# Patient Record
Sex: Male | Born: 1940 | Race: White | Hispanic: No | Marital: Married | Smoking: Former smoker
Health system: Southern US, Community
[De-identification: ages and names within clinical notes are randomized; demographics above are authoritative.]

## PROBLEM LIST (undated history)

## (undated) DIAGNOSIS — G20A1 Parkinson's disease without dyskinesia, without mention of fluctuations: Secondary | ICD-10-CM

## (undated) DIAGNOSIS — E119 Type 2 diabetes mellitus without complications: Secondary | ICD-10-CM

## (undated) DIAGNOSIS — I10 Essential (primary) hypertension: Secondary | ICD-10-CM

## (undated) DIAGNOSIS — E7800 Pure hypercholesterolemia, unspecified: Secondary | ICD-10-CM

## (undated) HISTORY — DX: Parkinson's disease without dyskinesia, without mention of fluctuations: G20.A1

## (undated) HISTORY — PX: MASTOIDECTOMY: SHX711

## (undated) HISTORY — DX: Pure hypercholesterolemia, unspecified: E78.00

## (undated) HISTORY — PX: CATARACT EXTRACTION: SUR2

## (undated) HISTORY — PX: VASECTOMY: SHX75

---

## 2016-12-04 ENCOUNTER — Other Ambulatory Visit: Payer: Self-pay | Admitting: Otolaryngology

## 2016-12-04 DIAGNOSIS — H60391 Other infective otitis externa, right ear: Secondary | ICD-10-CM

## 2016-12-04 DIAGNOSIS — H6611 Chronic tubotympanic suppurative otitis media, right ear: Secondary | ICD-10-CM

## 2016-12-04 DIAGNOSIS — H903 Sensorineural hearing loss, bilateral: Secondary | ICD-10-CM

## 2016-12-09 ENCOUNTER — Ambulatory Visit
Admission: RE | Admit: 2016-12-09 | Discharge: 2016-12-09 | Disposition: A | Discharge status: Home / Self Care | Payer: Medicare Other | Source: Ambulatory Visit | Attending: Otolaryngology | Admitting: Otolaryngology

## 2016-12-09 DIAGNOSIS — H903 Sensorineural hearing loss, bilateral: Secondary | ICD-10-CM

## 2016-12-09 DIAGNOSIS — H60391 Other infective otitis externa, right ear: Secondary | ICD-10-CM

## 2016-12-09 DIAGNOSIS — H6611 Chronic tubotympanic suppurative otitis media, right ear: Secondary | ICD-10-CM

## 2016-12-09 IMAGING — CT CT TEMPORAL BONES W/ CM
1 series · 15 of 30 positions shown, 19 images · IV contrast (iopamidol)
Comparison: None.

CLINICAL DATA: Chronic right otitis media. Ear drainage. Hearing
loss.

Creatinine was obtained on site at [HOSPITAL] at [HOSPITAL].Results: Creatinine 0.7 mg/dL.
EXAM:
CT TEMPORAL BONES WITH CONTRAST
TECHNIQUE: Axial and coronal plane CT imaging of the petrous temporal bones was
performed with thin-collimation image reconstruction after
intravenous contrast administration. Multiplanar CT image
reconstructions were also generated.
CONTRAST:  75mL PFDW5S-4TT IOPAMIDOL (PFDW5S-4TT) INJECTION 61%

[Series 4: axial soft tissue · axial · 0.47mm/px · z∈[-152,-92]mm · 15 of 34 slices shown, 19 images]
[im 2/34  brain]
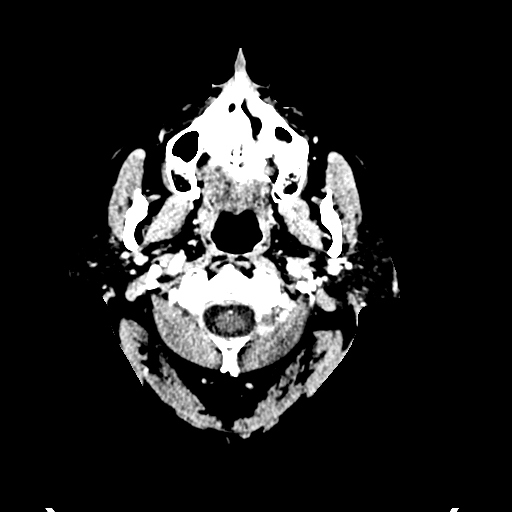
[im 2/34  bone]
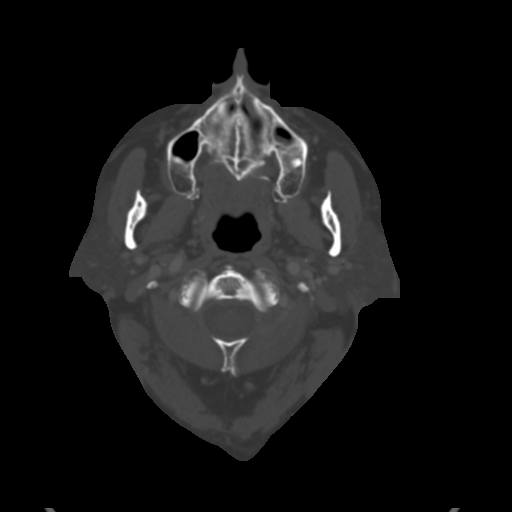
[im 4/34  bone]
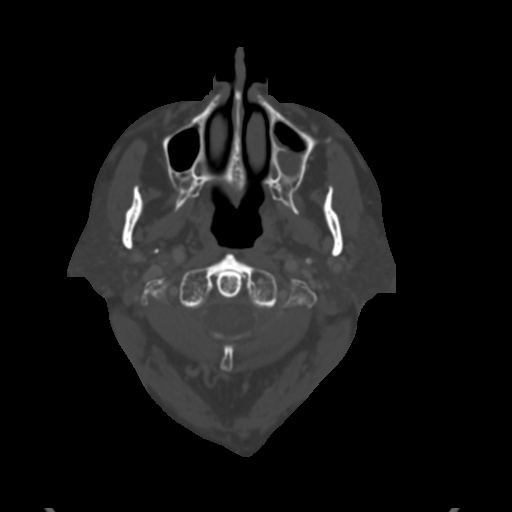
[im 6/34  bone]
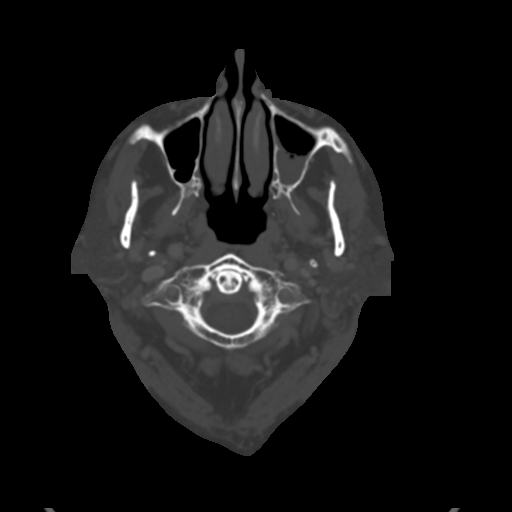
[im 8/34  bone]
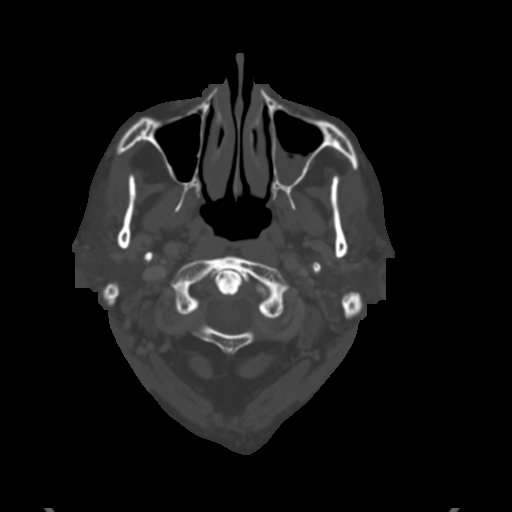
[im 11/34  brain]
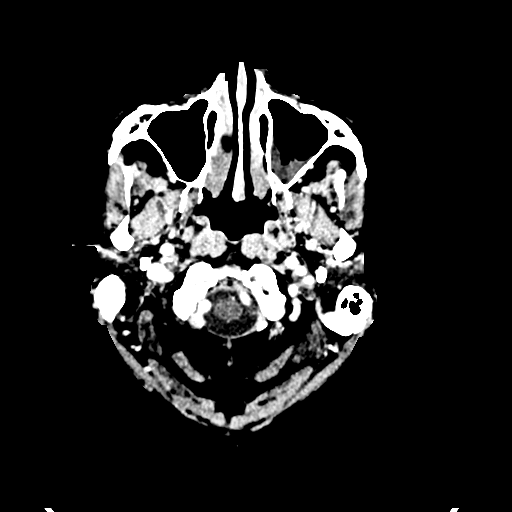
[im 11/34  bone]
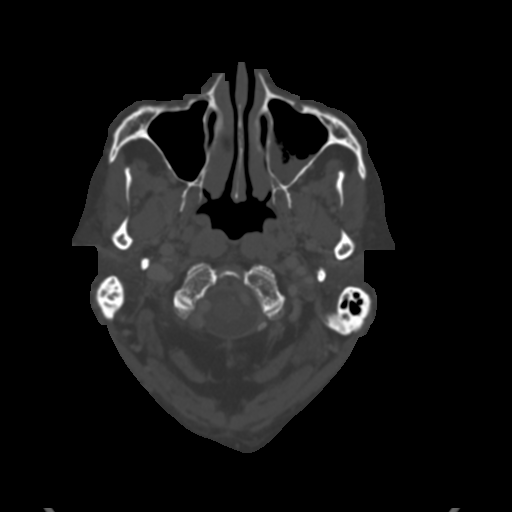
[im 13/34  bone]
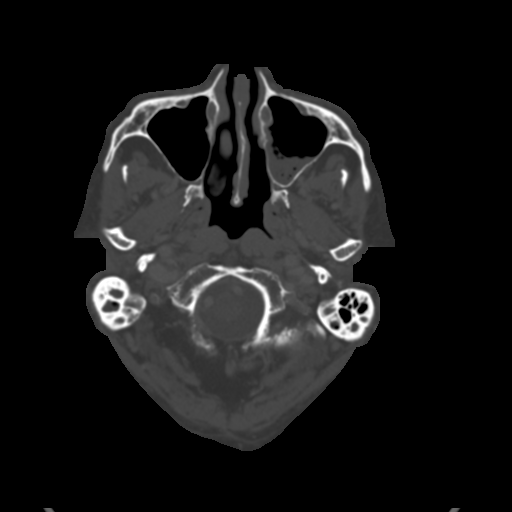
[im 15/34  bone]
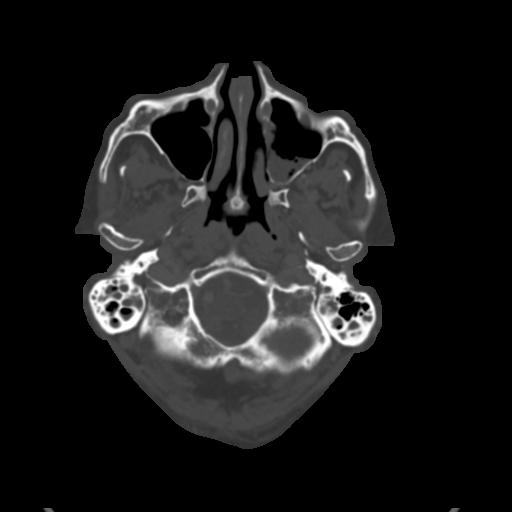
[im 18/34  bone]
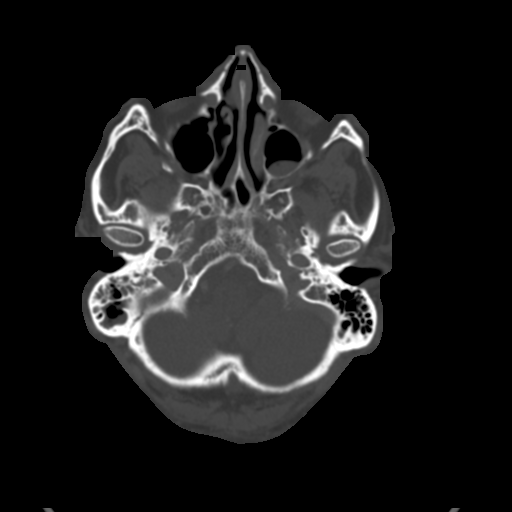
[im 19/34  brain]
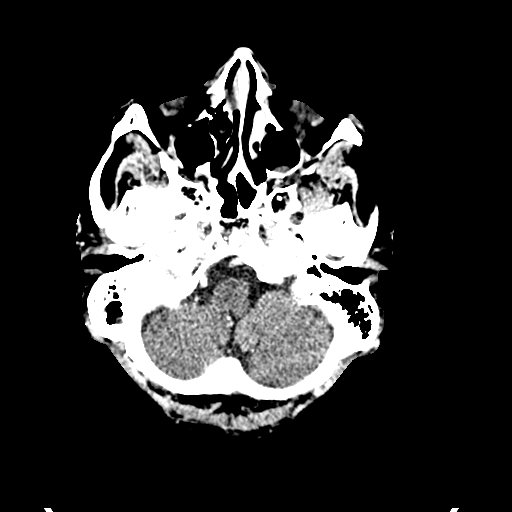
[im 19/34  bone]
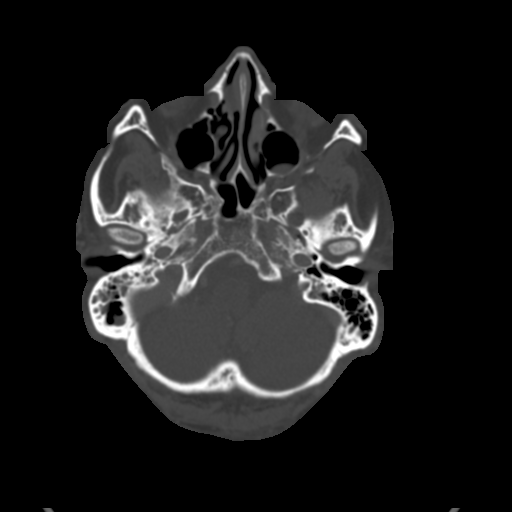
[im 21/34  bone]
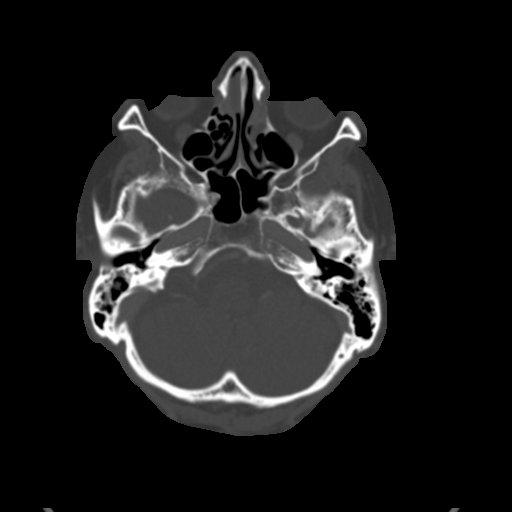
[im 23/34  bone]
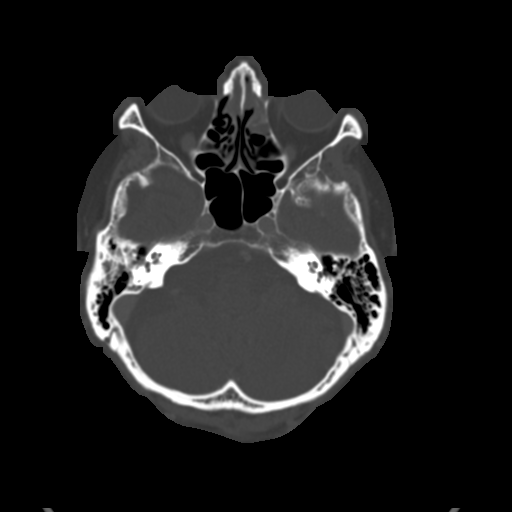
[im 26/34  bone]
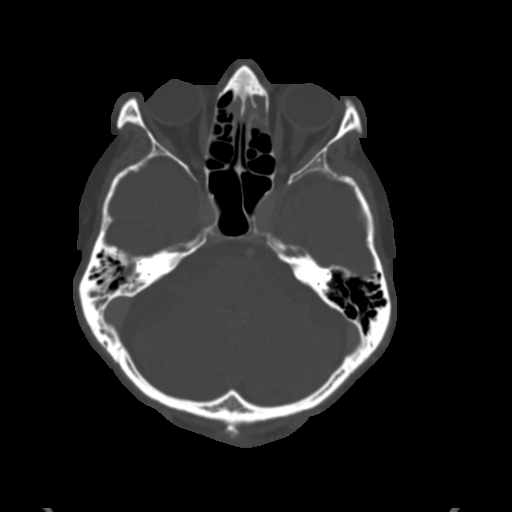
[im 28/34  brain]
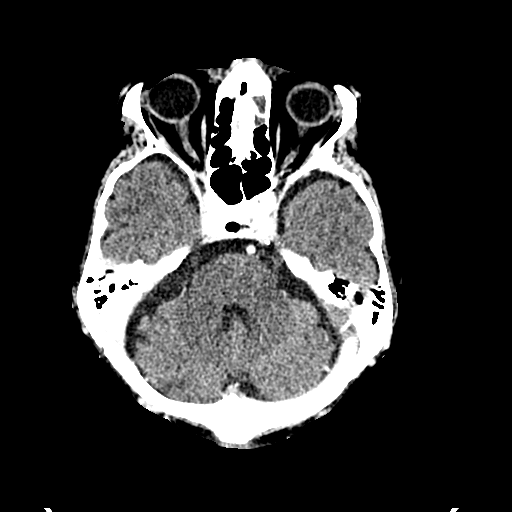
[im 28/34  bone]
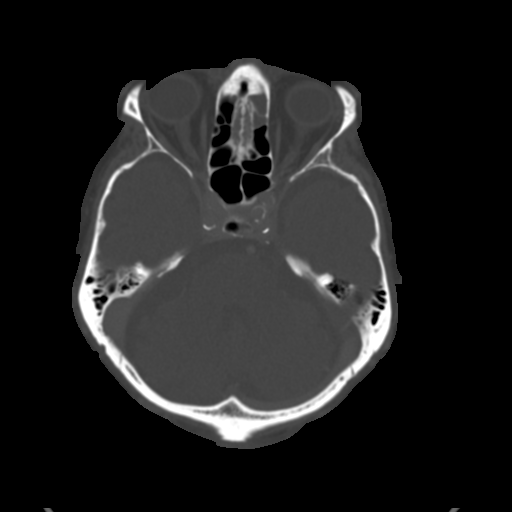
[im 30/34  bone]
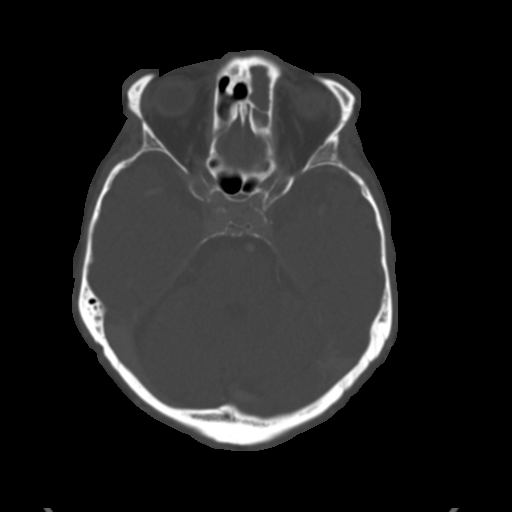
[im 32/34  bone]
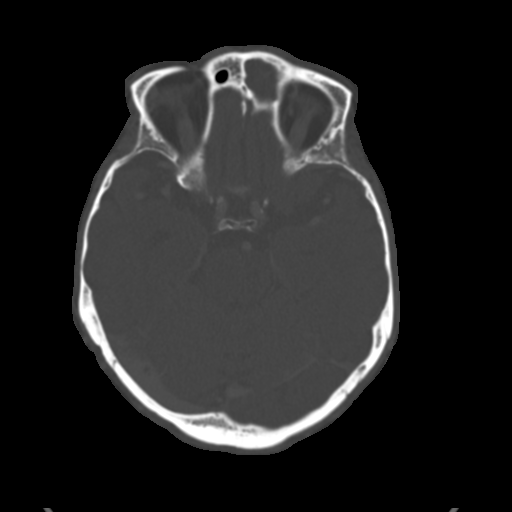

[15 of 30 positions shown; findings below may reference images not displayed]

FINDINGS: RIGHT: There is diffuse thickening of the tympanic membrane.
Moderate volume soft tissue is present in the tympanic cavity,
predominantly located in the epitympanum and partially surrounding
the ossicles. The malleus and incus appear intact, while the stapes
is suboptimally visualized due to surrounding soft tissue. Prussak's
space is opacified, however the scutum appears intact and no osseous
erosive changes are seen elsewhere. The internal auditory canal,
cochlea, vestibule, and semicircular canals are unremarkable. There
is moderate mastoid air cell opacification without coalescence.

LEFT: A tympanostomy tube is present, although it may be partially
extruded. A small volume of strandy soft tissue is present tympanic
cavity, including partially surrounding the stapes which is
suboptimally visualized. The malleus and incus appear intact. The
internal auditory canal, cochlea, vestibule, and semicircular canals
are unremarkable. There is a small mastoid effusion without air cell
coalescence.

There is moderate volume bubbly fluid in the left maxillary sinus.
The visualized portion of the left frontal sinus is completely
opacified, as are the left frontal recess and a few anterior left
ethmoid air cells. The visualized portion of the brain is
unremarkable. Bilateral cataract extraction is noted.
IMPRESSION: 1. Soft tissue in the right tympanic cavity and moderate right
mastoid effusion. This may reflect chronic otomastoiditis. No
erosive osseous changes to suggest cholesteatoma.
2. Small left mastoid effusion with small volume soft tissue in the
left tympanic cavity.
3. Bubbly fluid in the left maxillary sinus. Opacified left frontal
sinus and anterior left ethmoid air cells.

## 2016-12-09 MED ORDER — IOPAMIDOL (ISOVUE-300) INJECTION 61%
75.0000 mL | Freq: Once | INTRAVENOUS | Status: AC | PRN
  Administered 2016-12-09: 75 mL via INTRAVENOUS

## 2016-12-10 ENCOUNTER — Other Ambulatory Visit: Payer: Self-pay | Admitting: Otolaryngology

## 2021-07-18 ENCOUNTER — Encounter (HOSPITAL_BASED_OUTPATIENT_CLINIC_OR_DEPARTMENT_OTHER): Payer: Self-pay | Admitting: Pediatrics

## 2021-07-18 ENCOUNTER — Other Ambulatory Visit: Payer: Self-pay

## 2021-07-18 ENCOUNTER — Telehealth: Payer: Self-pay

## 2021-07-18 ENCOUNTER — Emergency Department (HOSPITAL_BASED_OUTPATIENT_CLINIC_OR_DEPARTMENT_OTHER): Payer: Medicare Other

## 2021-07-18 ENCOUNTER — Emergency Department (HOSPITAL_BASED_OUTPATIENT_CLINIC_OR_DEPARTMENT_OTHER)
Admission: EM | Admit: 2021-07-18 | Discharge: 2021-07-18 | Disposition: A | Discharge status: Home / Self Care | Payer: Medicare Other | Attending: Emergency Medicine | Admitting: Emergency Medicine

## 2021-07-18 DIAGNOSIS — R531 Weakness: Secondary | ICD-10-CM | POA: Diagnosis present

## 2021-07-18 DIAGNOSIS — R5381 Other malaise: Secondary | ICD-10-CM

## 2021-07-18 DIAGNOSIS — E119 Type 2 diabetes mellitus without complications: Secondary | ICD-10-CM | POA: Insufficient documentation

## 2021-07-18 DIAGNOSIS — E860 Dehydration: Secondary | ICD-10-CM | POA: Insufficient documentation

## 2021-07-18 DIAGNOSIS — R55 Syncope and collapse: Secondary | ICD-10-CM | POA: Insufficient documentation

## 2021-07-18 DIAGNOSIS — I10 Essential (primary) hypertension: Secondary | ICD-10-CM | POA: Diagnosis not present

## 2021-07-18 HISTORY — DX: Type 2 diabetes mellitus without complications: E11.9

## 2021-07-18 HISTORY — DX: Essential (primary) hypertension: I10

## 2021-07-18 LAB — URINALYSIS, ROUTINE W REFLEX MICROSCOPIC
Bilirubin Urine: NEGATIVE
Glucose, UA: NEGATIVE mg/dL
Hgb urine dipstick: NEGATIVE
Ketones, ur: 40 mg/dL — AB
Leukocytes,Ua: NEGATIVE
Nitrite: NEGATIVE
Protein, ur: NEGATIVE mg/dL
Specific Gravity, Urine: >=1.03 (ref 1.005–1.030)
pH: 5.5 (ref 5.0–8.0)

## 2021-07-18 LAB — I-STAT VENOUS BLOOD GAS, ED
Acid-Base Excess: 0 mmol/L (ref 0.0–2.0)
Bicarbonate: 25.1 mmol/L (ref 20.0–28.0)
Calcium, Ion: 1.18 mmol/L (ref 1.15–1.40)
HCT: 46 % (ref 39.0–52.0)
Hemoglobin: 15.6 g/dL (ref 13.0–17.0)
O2 Saturation: 59 %
Patient temperature: 98.1
Potassium: 4.2 mmol/L (ref 3.5–5.1)
Sodium: 134 mmol/L — ABNORMAL LOW (ref 135–145)
TCO2: 26 mmol/L (ref 22–32)
pCO2, Ven: 42.3 mmHg — ABNORMAL LOW (ref 44–60)
pH, Ven: 7.381 (ref 7.25–7.43)
pO2, Ven: 31 mmHg — CL (ref 32–45)

## 2021-07-18 LAB — AMMONIA: Ammonia: 11 umol/L (ref 9–35)

## 2021-07-18 LAB — HEPATIC FUNCTION PANEL
ALT: 38 U/L (ref 0–44)
AST: 30 U/L (ref 15–41)
Albumin: 3.9 g/dL (ref 3.5–5.0)
Alkaline Phosphatase: 63 U/L (ref 38–126)
Bilirubin, Direct: 0.3 mg/dL — ABNORMAL HIGH (ref 0.0–0.2)
Indirect Bilirubin: 1.1 mg/dL — ABNORMAL HIGH (ref 0.3–0.9)
Total Bilirubin: 1.4 mg/dL — ABNORMAL HIGH (ref 0.3–1.2)
Total Protein: 6.7 g/dL (ref 6.5–8.1)

## 2021-07-18 LAB — BASIC METABOLIC PANEL
Anion gap: 14 (ref 5–15)
BUN: 20 mg/dL (ref 8–23)
CO2: 22 mmol/L (ref 22–32)
Calcium: 9.4 mg/dL (ref 8.9–10.3)
Chloride: 100 mmol/L (ref 98–111)
Creatinine, Ser: 1.22 mg/dL (ref 0.61–1.24)
GFR, Estimated: 60 mL/min — ABNORMAL LOW (ref >60)
Glucose, Bld: 147 mg/dL — ABNORMAL HIGH (ref 70–99)
Potassium: 4.3 mmol/L (ref 3.5–5.1)
Sodium: 136 mmol/L (ref 135–145)

## 2021-07-18 LAB — CBC
HCT: 45.3 % (ref 39.0–52.0)
Hemoglobin: 15.8 g/dL (ref 13.0–17.0)
MCH: 32.7 pg (ref 26.0–34.0)
MCHC: 34.9 g/dL (ref 30.0–36.0)
MCV: 93.8 fL (ref 80.0–100.0)
Platelets: 209 10*3/uL (ref 150–400)
RBC: 4.83 MIL/uL (ref 4.22–5.81)
RDW: 13.3 % (ref 11.5–15.5)
WBC: 9.6 10*3/uL (ref 4.0–10.5)
nRBC: 0 % (ref 0.0–0.2)

## 2021-07-18 LAB — CK: Total CK: 84 U/L (ref 49–397)

## 2021-07-18 LAB — MAGNESIUM: Magnesium: 1.7 mg/dL (ref 1.7–2.4)

## 2021-07-18 LAB — LIPASE, BLOOD: Lipase: 36 U/L (ref 11–51)

## 2021-07-18 LAB — TROPONIN I (HIGH SENSITIVITY)
Troponin I (High Sensitivity): 5 ng/L (ref <18)
Troponin I (High Sensitivity): 6 ng/L (ref <18)

## 2021-07-18 LAB — CBG MONITORING, ED: Glucose-Capillary: 156 mg/dL — ABNORMAL HIGH (ref 70–99)

## 2021-07-18 IMAGING — CT CT HEAD W/O CM
3 series · 14 of 47 positions shown, 16 images · non-contrast
Comparison: None Available.

CLINICAL DATA: Loss of consciousness for 2-3 minutes last [REDACTED]



[Series 2: head wo · axial · 0.45mm/px · z∈[-165,-15]mm · 8 of 36 slices shown, 10 images]
[im 3/36  brain]
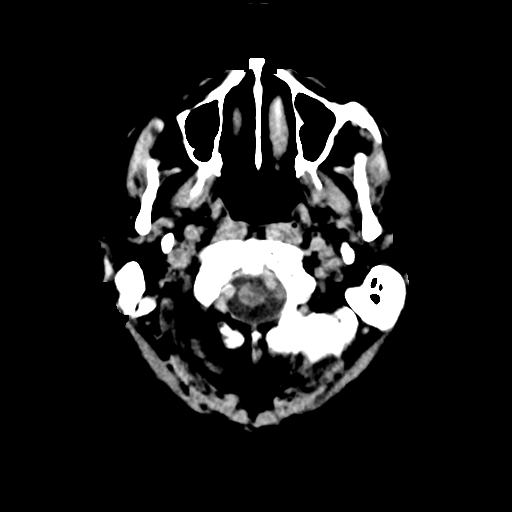
[im 3/36  bone]
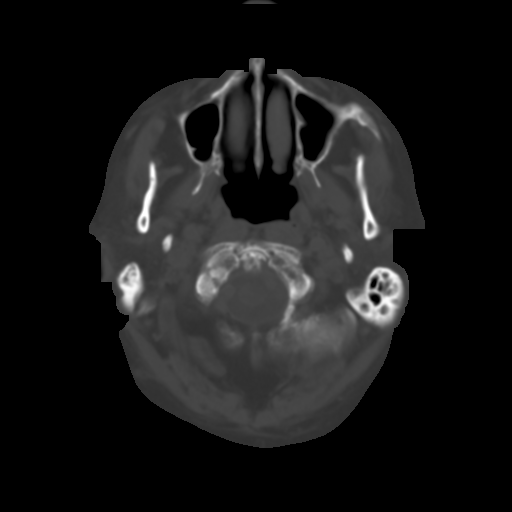
[im 8/36  brain]
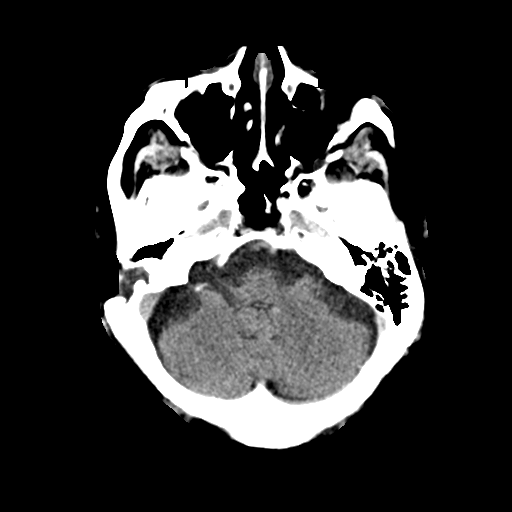
[im 11/36  brain]
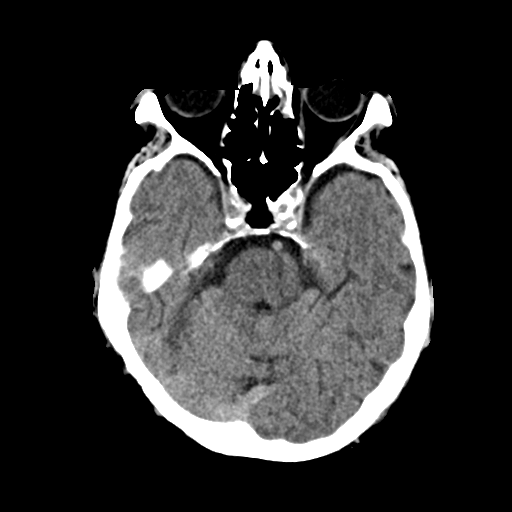
[im 16/36  brain]
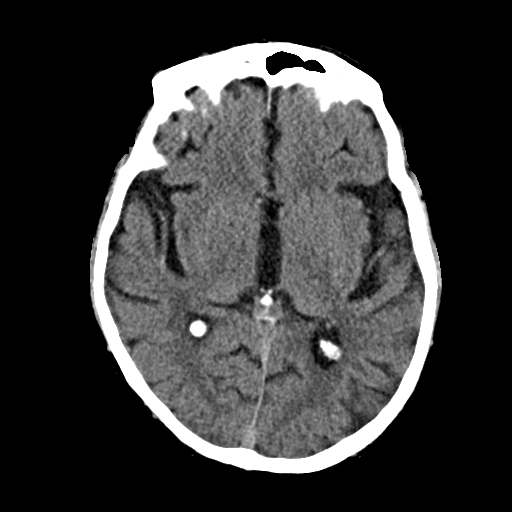
[im 20/36  brain]
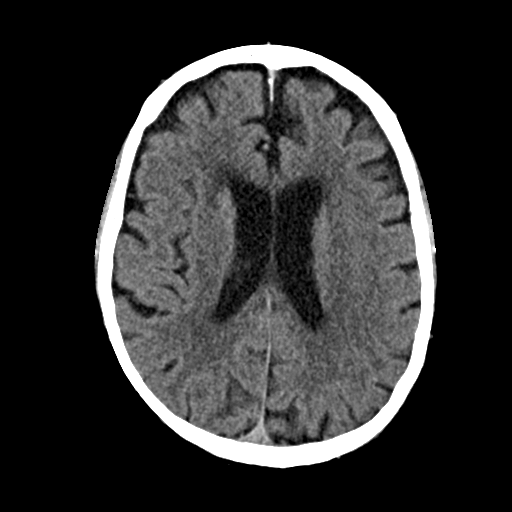
[im 20/36  bone]
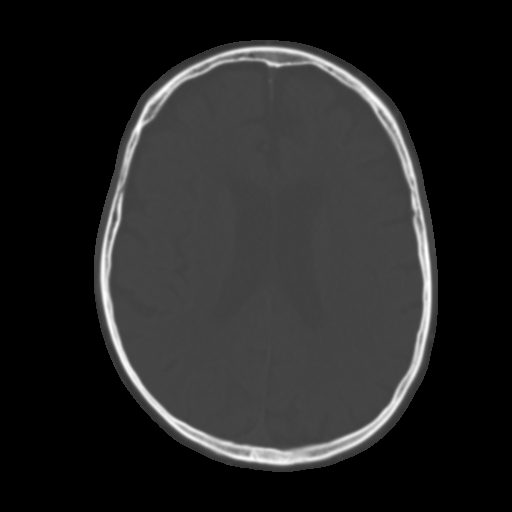
[im 25/36  brain]
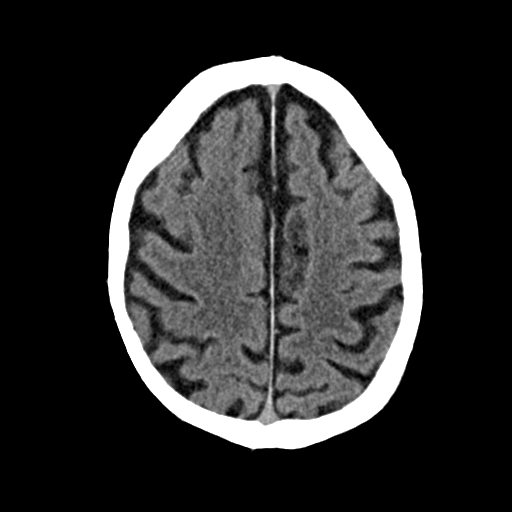
[im 28/36  brain]
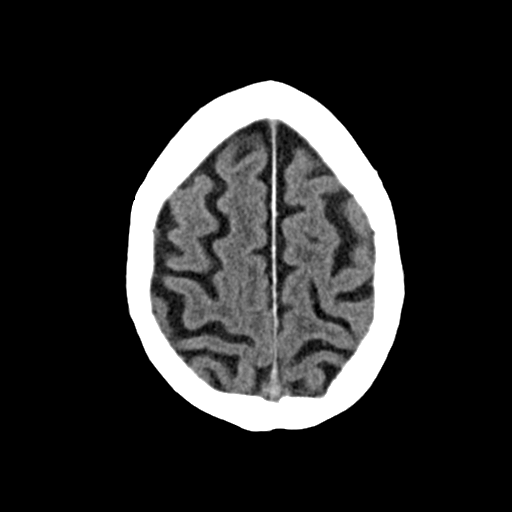
[im 33/36  brain]
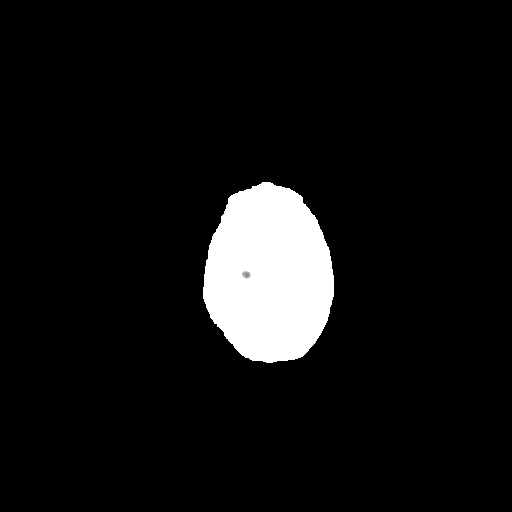

[Series 4: coronal soft · coronal · 0.34mm/px · 3 of 71 slices shown]
[im 24/71  brain]
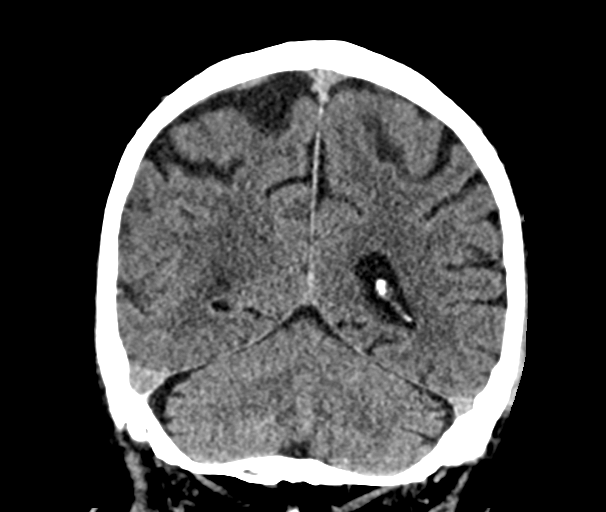
[im 32/71  brain]
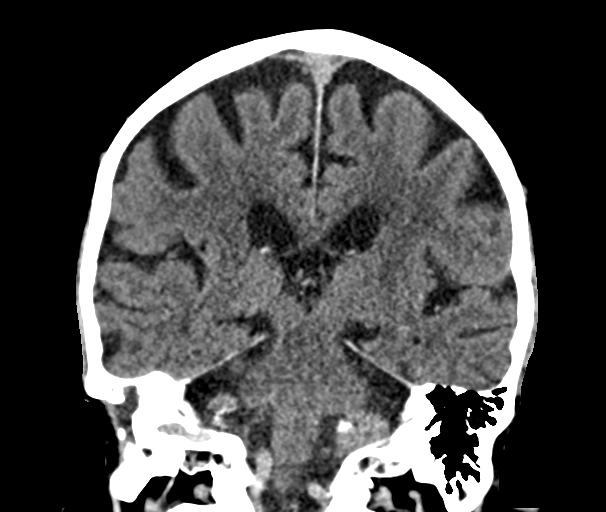
[im 39/71  brain]
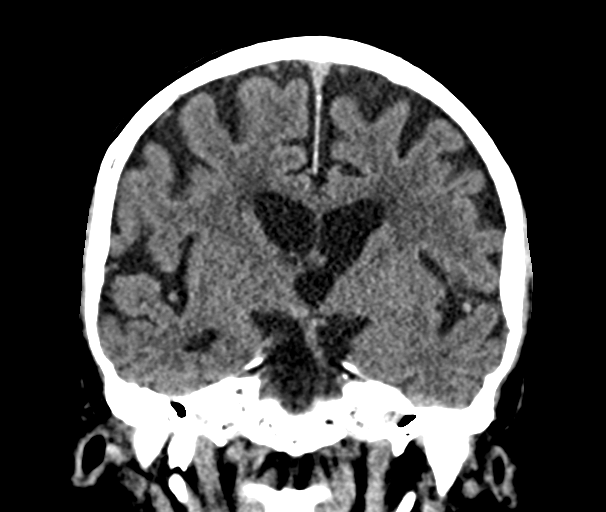

[Series 5: sag soft · sagittal · 0.34mm/px · 3 of 67 slices shown]
[im 23/67  brain]
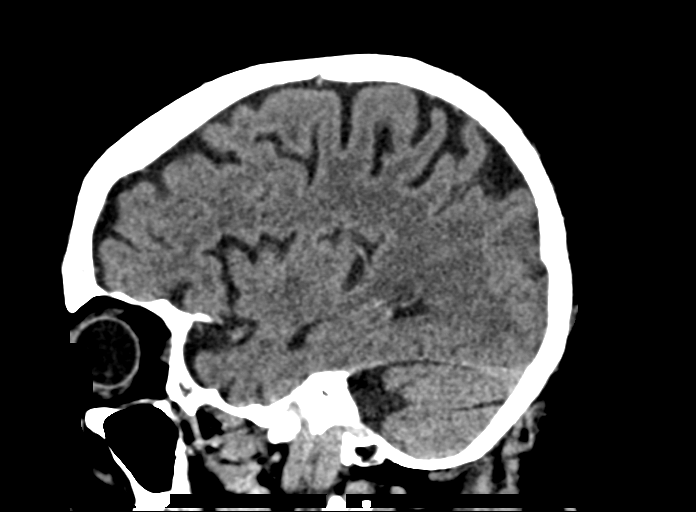
[im 34/67  brain]
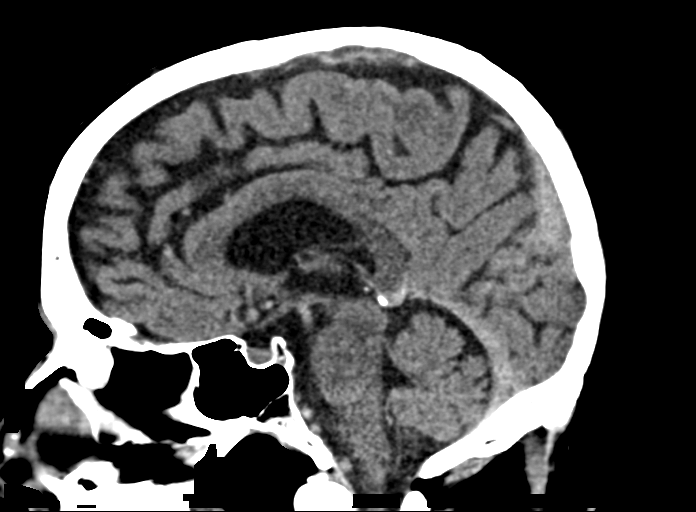
[im 45/67  brain]
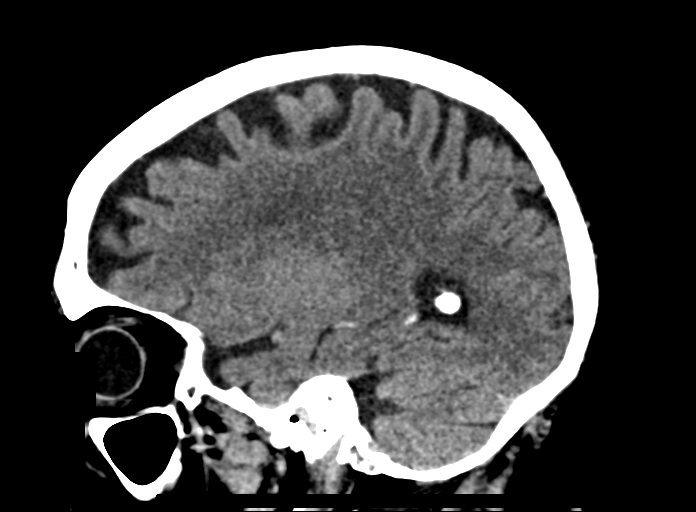

[14 of 47 positions shown; findings below may reference images not displayed]

FINDINGS: Brain: There is no acute intracranial hemorrhage, extra-axial fluid
collection, or acute infarct.

Parenchymal volume is normal. The ventricles are normal in size.
Gray-white differentiation is preserved. Patchy hypodensity in the
subcortical and periventricular white matter likely reflects sequela
of chronic white matter microangiopathy.

There is no mass lesion.  There is no mass effect or midline shift.

Vascular: There is calcification of the bilateral cavernous ICAs.

Skull: Normal. Negative for fracture or focal lesion.

Sinuses/Orbits: There is moderate mucosal thickening in the left
frontal sinus. Bilateral lens implants are in place. The globes and
orbits are otherwise unremarkable.

Other: Postsurgical changes reflecting right canal wall up
mastoidectomy are noted. There is soft tissue density completely
opacifying the mastoid bowl and nearly completely opacifying the
middle ear cavity. There is no definite ossicular erosion. There is
trace fluid in the left mastoid tip.
IMPRESSION: 1. No acute intracranial pathology.
2. Status post right mastoidectomy with complete opacification of
the mastoid bowl and near-complete opacification of the middle ear
cavity. No definite ossicular erosion.

## 2021-07-18 IMAGING — DX DG CHEST 1V PORT
1 series · 1 of 1 positions shown · non-contrast
Comparison: 06/05/2019

CLINICAL DATA: Altered mental status

EXAM:
PORTABLE CHEST 1 VIEW

[chest ap]
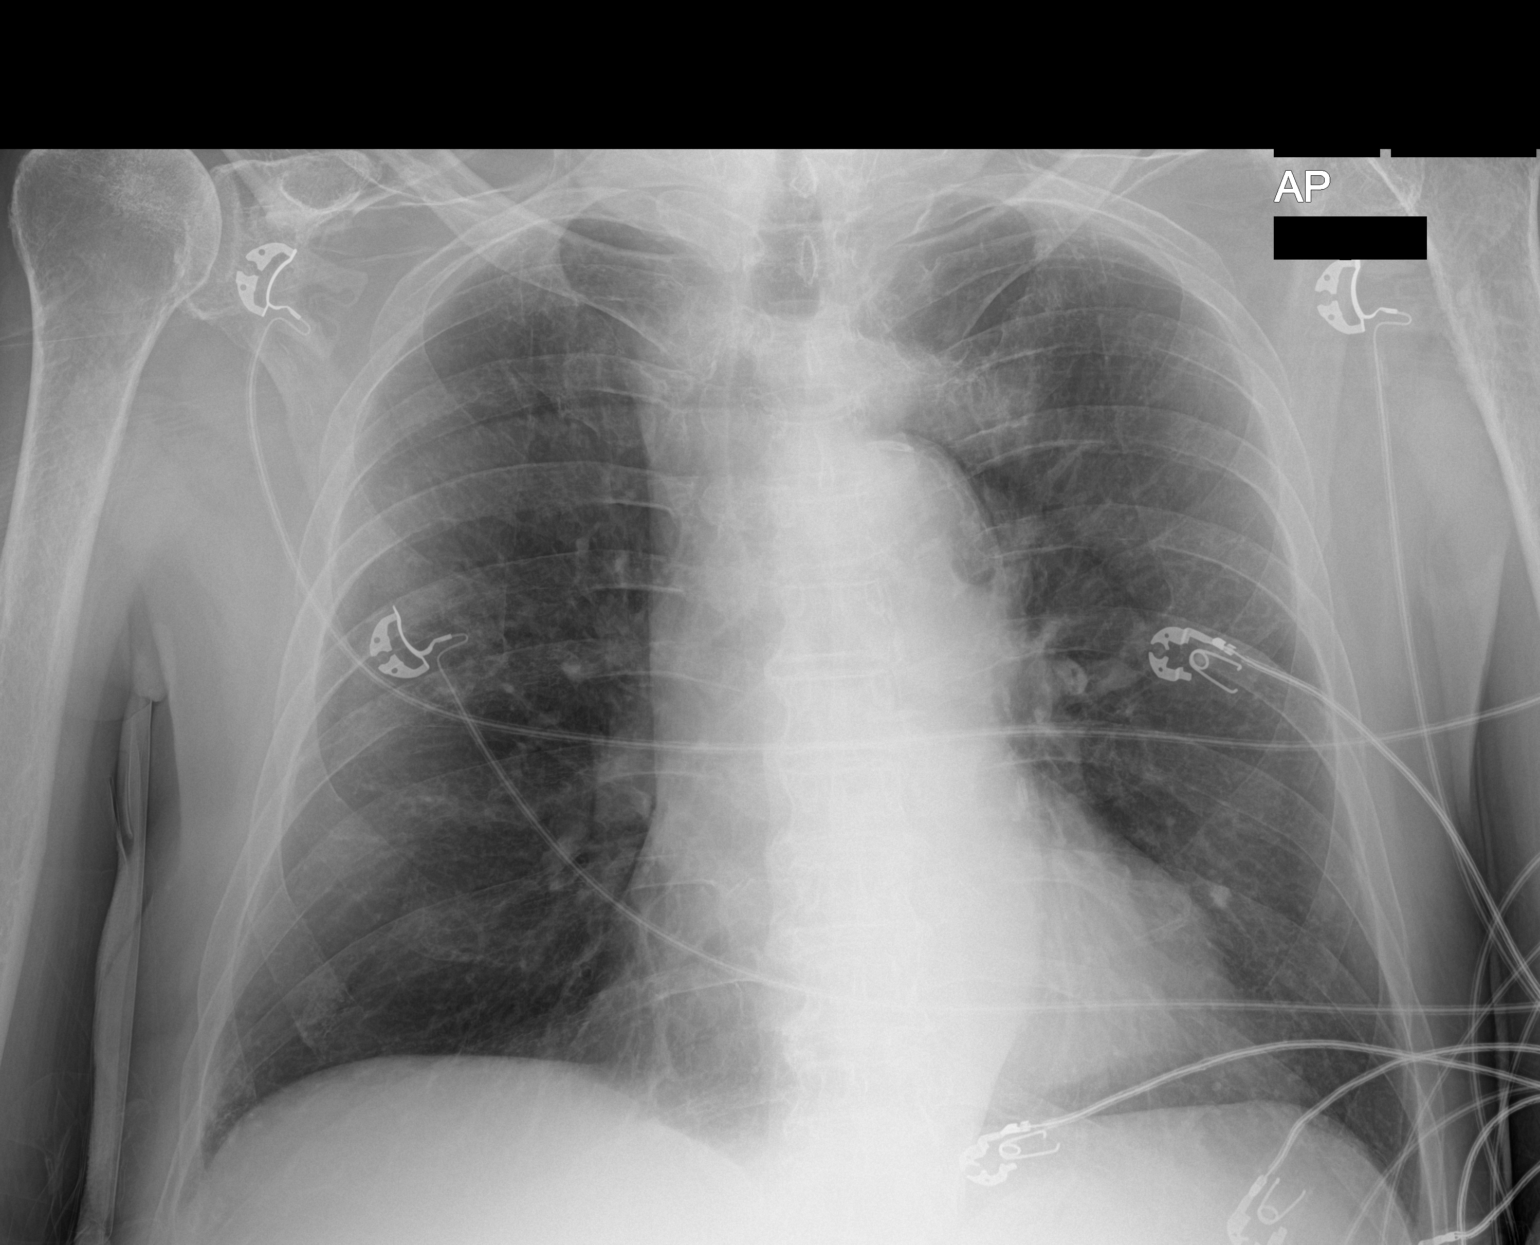

[1 of 1 positions shown; findings below may reference images not displayed]

FINDINGS: Stable cardiomediastinal contours. Aortic atherosclerosis. No focal
airspace consolidation, pleural effusion, or pneumothorax.
IMPRESSION: No active disease.

## 2021-07-18 MED ORDER — SODIUM CHLORIDE 0.9 % IV BOLUS
1000.0000 mL | Freq: Once | INTRAVENOUS | Status: AC
  Administered 2021-07-18: 1000 mL via INTRAVENOUS

## 2021-07-18 NOTE — ED Notes (Signed)
X RAY at bedside 

## 2021-07-18 NOTE — Progress Notes (Signed)
Transition of Care [facility]) - Emergency Department Mini Assessment ? ? ?Patient Details  ?Name: Bob Long ?MRN: [id number] ?Date of Birth: 1940-11-11 ? ?Transition of Care (TOC) CM/SW Contact:    ?Lavenia Atlas, RN ?Phone Number: ?07/18/2021, 12:46 PM ? ? ?Clinical Narrative: ?Patient presents to [facility] ED for generalized weakness, has a hx of falls. RNCM received TOC consult for [facility] services.  ? ? ?ED Mini Assessment: ? RNCM spoke with patient's wife who reports having a lift chair and feels they may need assistance with getting a walk in tub. RNCM  sent referrals to multiple HHC agencies. RNCM advised walk in tub may not be covered under insurance however maybe able to provide resources.  ? ?RNCM awaiting response for acceptance for [facility] services. ?  ?Patient Contact and Communications ?  ?  [name],[name] (Spouse)  ?[phone] [facility] Phone) ? ?Admission diagnosis:  Stroke Symptons ?There are no problems to display for this patient. ? ?PCP:  Daylene Katayama, PA ?Pharmacy:  No Pharmacies Listed  ?

## 2021-07-18 NOTE — ED Notes (Signed)
Patient transported to CT 

## 2021-07-18 NOTE — ED Triage Notes (Addendum)
Arrived POV w/ wife and daughter at bedside; reported patient loss consciousness for approx 2-3 mins while seating down getting bathed by wife last Monday. Wife reported patient loss control of bladder and drooled during the episode. Arrived today from PCP for further eval; patient denies pain when asked; wife concerned he has generalized weakness; stated history of fall about a month ago w/ + head injury. ?

## 2021-07-18 NOTE — Progress Notes (Addendum)
Karoline Caldwell, BSN, RN, CCM, [place] [phone] ?RNCM spoke with pt at bedside regarding discharge planning for Home Health Services. Offered pt medicare.gov list of home health agencies to choose from.  Pt chose [facility] to render services. Pearson Grippe [facility] notified. Patient made aware that [facility] will be in contact in 24-48 hours.  No DME needs identified at this time.  ? ? ?HHC information attached to AVS.  ? ?No additional TOC needs at this time. ?

## 2021-07-18 NOTE — ED Provider Notes (Signed)
?[facility] EMERGENCY DEPARTMENT ?Provider Note ? ? ?CSN: [id number] ?Arrival date & time: 07/18/21  1041 ? ?  ? ?History ? ?Chief Complaint  ?Patient presents with  ? Loss of Consciousness  ? ? ?Bob Long is a 81 y.o. male. ? ? Patient as above with significant medical history as below, including DM, hypertension who presents to the ED with complaint of fall, weakness, deconditioning.  Patient lives with spouse.  Spouse reports over the past 6 months he has been progressively declining.  On Monday she was bathing him and he had a syncopal episode.  He was unconscious for about 2 minutes.  Per the spouse, no seizure-like activity was reported.  No head injury.  She lowered him to the ground.  He was incontinent of urine.  He was not confused/postictal following the episode.  Spouse also reports about a month ago he fell and struck his head on the ground.  Patient was opposed to going to the hospital at that time. ? ?Patient also drinks 2 glasses of wine nightly. ? ? ?Past Medical History: ?No date: Diabetes mellitus without complication (HCC) ?No date: Hypertension ? ?History reviewed. No pertinent surgical history.  ? ? ? ?Loss of Consciousness ?Associated symptoms: no chest pain, no confusion, no fever, no headaches, no nausea, no palpitations, no shortness of breath and no vomiting   ? ?  ? ?Home Medications ?Prior to Admission medications   ?Not on File  ?   ? ?Allergies    ?Patient has no known allergies.   ? ?Review of Systems   ?Review of Systems  ?Constitutional:  Negative for chills and fever.  ?HENT:  Negative for facial swelling and trouble swallowing.   ?Eyes:  Negative for photophobia and visual disturbance.  ?Respiratory:  Negative for cough and shortness of breath.   ?Cardiovascular:  Positive for syncope. Negative for chest pain and palpitations.  ?Gastrointestinal:  Negative for abdominal pain, nausea and vomiting.  ?Endocrine: Negative for polydipsia and polyuria.  ?Genitourinary:   Negative for difficulty urinating and hematuria.  ?Musculoskeletal:  Negative for gait problem and joint swelling.  ?Skin:  Negative for pallor and rash.  ?Neurological:  Positive for syncope and light-headedness. Negative for headaches.  ?Psychiatric/Behavioral:  Negative for agitation and confusion.   ? ?Physical Exam ?Updated Vital Signs ?BP (!) 158/84   Pulse 76   Temp 98.1 ?F (36.7 ?C) (Oral)   Resp 18   Ht 5\' 8"  (1.727 m)   Wt 72.6 kg   SpO2 97%   BMI 24.33 kg/m?  ?Physical Exam ?Vitals and nursing note reviewed.  ?Constitutional:   ?   General: He is not in acute distress. ?   Appearance: Normal appearance. He is well-developed. He is not ill-appearing or diaphoretic.  ?   Comments: Hard of hearing left  ?HENT:  ?   Head: Normocephalic and atraumatic.  ?   Right Ear: External ear normal.  ?   Left Ear: External ear normal.  ?   Mouth/Throat:  ?   Mouth: Mucous membranes are moist.  ?Eyes:  ?   General: No scleral icterus. ?   Extraocular Movements: Extraocular movements intact.  ?   Pupils: Pupils are equal, round, and reactive to light.  ?Cardiovascular:  ?   Rate and Rhythm: Normal rate and regular rhythm.  ?   Pulses: Normal pulses.  ?   Heart sounds: Normal heart sounds.  ?Pulmonary:  ?   Effort: Pulmonary effort is normal. No respiratory distress.  ?  Breath sounds: Normal breath sounds.  ?Abdominal:  ?   General: Abdomen is flat.  ?   Palpations: Abdomen is soft.  ?   Tenderness: There is no abdominal tenderness.  ?Musculoskeletal:     ?   General: Normal range of motion.  ?   Cervical back: Normal range of motion.  ?   Right lower leg: No edema.  ?   Left lower leg: No edema.  ?Skin: ?   General: Skin is warm and dry.  ?   Capillary Refill: Capillary refill takes less than 2 seconds.  ?Neurological:  ?   Mental Status: He is alert and oriented to person, place, and time.  ?   GCS: GCS eye subscore is 4. GCS verbal subscore is 5. GCS motor subscore is 6.  ?   Cranial Nerves: Cranial nerves 2-12  are intact.  ?   Sensory: Sensation is intact.  ?   Motor: Motor function is intact.  ?   Coordination: Coordination is intact.  ?   Gait: Gait is intact.  ?Psychiatric:     ?   Mood and Affect: Mood normal.     ?   Behavior: Behavior normal.  ? ? ?ED Results / Procedures / Treatments   ?Labs ?(all labs ordered are listed, but only abnormal results are displayed) ?Labs Reviewed  ?BASIC METABOLIC PANEL - Abnormal; Notable for the following components:  ?    Result Value  ? Glucose, Bld 147 (*)   ? GFR, Estimated 60 (*)   ? All other components within normal limits  ?URINALYSIS, ROUTINE W REFLEX MICROSCOPIC - Abnormal; Notable for the following components:  ? Ketones, ur 40 (*)   ? All other components within normal limits  ?HEPATIC FUNCTION PANEL - Abnormal; Notable for the following components:  ? Total Bilirubin 1.4 (*)   ? Bilirubin, Direct 0.3 (*)   ? Indirect Bilirubin 1.1 (*)   ? All other components within normal limits  ?CBG MONITORING, ED - Abnormal; Notable for the following components:  ? Glucose-Capillary 156 (*)   ? All other components within normal limits  ?I-STAT VENOUS BLOOD GAS, ED - Abnormal; Notable for the following components:  ? pCO2, Ven 42.3 (*)   ? pO2, Ven 31 (*)   ? Sodium 134 (*)   ? All other components within normal limits  ?CBC  ?CK  ?MAGNESIUM  ?AMMONIA  ?LIPASE, BLOOD  ?CBG MONITORING, ED  ?TROPONIN I (HIGH SENSITIVITY)  ?TROPONIN I (HIGH SENSITIVITY)  ? ? ?EKG ?EKG Interpretation ? ?Date/Time:  Friday Jul 18 2021 10:54:20 EDT ?Ventricular Rate:  85 ?PR Interval:    ?QRS Duration: 139 ?QT Interval:  373 ?QTC Calculation: 444 ?R Axis:   98 ?Text Interpretation: Accelerated junctional rhythm Right bundle branch block Nonspecific T abnormalities, lateral leads Minimal ST elevation, lateral leads Artifact in lead(s) I III aVR aVL aVF V1 V3 V4 V5 V6 Technically poor tracing Interpretation limited secondary to artifact Confirmed by Tanda Rockers ([id number]) on 07/18/2021 1:15:43 PM ? ?Radiology ?CT  HEAD WO CONTRAST ? ?Result Date: 07/18/2021 ?CLINICAL DATA:  Loss of consciousness for 2-3 minutes last Monday EXAM: CT HEAD WITHOUT CONTRAST TECHNIQUE: Contiguous axial images were obtained from the base of the skull through the vertex without intravenous contrast. RADIATION DOSE REDUCTION: This exam was performed according to the departmental dose-optimization program which includes automated exposure control, adjustment of the mA and/or kV according to patient size and/or use of iterative reconstruction technique. COMPARISON:  None  Available. FINDINGS: Brain: There is no acute intracranial hemorrhage, extra-axial fluid collection, or acute infarct. Parenchymal volume is normal. The ventricles are normal in size. Stein Windhorst-white differentiation is preserved. Patchy hypodensity in the subcortical and periventricular white matter likely reflects sequela of chronic white matter microangiopathy. There is no mass lesion.  There is no mass effect or midline shift. Vascular: There is calcification of the bilateral cavernous ICAs. Skull: Normal. Negative for fracture or focal lesion. Sinuses/Orbits: There is moderate mucosal thickening in the left frontal sinus. Bilateral lens implants are in place. The globes and orbits are otherwise unremarkable. Other: Postsurgical changes reflecting right canal wall up mastoidectomy are noted. There is soft tissue density completely opacifying the mastoid bowl and nearly completely opacifying the middle ear cavity. There is no definite ossicular erosion. There is trace fluid in the left mastoid tip. IMPRESSION: 1. No acute intracranial pathology. 2. Status post right mastoidectomy with complete opacification of the mastoid bowl and near-complete opacification of the middle ear cavity. No definite ossicular erosion. Electronically Signed   By: Lesia HausenPeter  Noone M.D.   On: 07/18/2021 11:37  ? ?DG Chest Portable 1 View ? ?Result Date: 07/18/2021 ?CLINICAL DATA:  Altered mental status EXAM: PORTABLE  CHEST 1 VIEW COMPARISON:  06/05/2019 FINDINGS: Stable cardiomediastinal contours. Aortic atherosclerosis. No focal airspace consolidation, pleural effusion, or pneumothorax. IMPRESSION: No active disease. Elect

## 2021-07-18 NOTE — Discharge Instructions (Signed)
Please follow-up with case management regarding home health.  Please also follow-up with your primary care doctor regarding possible rehab placement if this is needed in the future. ? ? ?Have someone stay with you until you feel stable. Do not drive, operate machinery, or play sports until your caregiver says it is okay. Keep all follow-up appointments as directed by your caregiver. Lie down right away if you start feeling like you might faint. Breathe deeply and steadily. Wait until all the symptoms have passed.Drink enough fluids to keep your urine clear or pale yellow. If you are taking blood pressure or heart medicine, get up slowly, taking several minutes to sit and then stand. This can reduce dizziness. SEEK IMMEDIATE MEDICAL CARE IF: You have a severe headache. You have unusual pain in the chest, abdomen, or back. You are bleeding from the mouth or rectum, or you have a black or tarry stool. You have an irregular or very fast heartbeat. You have pain with breathing. You have repeated fainting or seizure-like jerking during an episode. You faint when sitting or lying down. You have confusion. You have difficulty walking. You have severe weakness. You have vision problems. If you fainted, call your local emergency services - do not drive yourself to the hospital. ?  ?Please return to the emergency department immediately for any new or concerning symptoms, or if you get worse. ?

## 2022-08-14 NOTE — Progress Notes (Signed)
 Chief Complaint  Patient presents with  . 6 month follow up    Patient denies concerns today. Wife states patient very weak. Patient has appointment with cardiology 08/24/22.   CC: Parkinson's disease/syncope     The patient is a 82 y.o. right handed White or Caucasian   Male  With unsteady gait, has fallen, not since last visit, bradykinesia, Parkinson's disease.  Tremor a little better. Gets sleepy. Weak all over/fatigue, sleeps a lot. Loss of consciousness 07/18/21 in shower, syncope. Has passed out again 06-20-2022, getting Cardiology work-up.Memory not bad..Some dizziness. Loss of smell recently. He had physical and occupational therapy which helped.. Sinemet makes him sleepy.   Some dyspnea.   The patient denies headaches, weakness,  numbness or tingling, nausea, vomiting, seizures, recent trauma.   Current Outpatient Medications:  .  ammonium lactate 12 % crea, APPLY TOPICALLY TO THE AFFECTED AREA EVERY DAY, Disp: , Rfl:  .  aspirin 81 mg EC tablet, Take 81 mg by mouth Once Daily., Disp: , Rfl:  .  atorvastatin (LIPITOR) 20 mg tablet, Take 1 tablet (20 mg total) by mouth daily., Disp: 90 tablet, Rfl: 1 .  carbidopa-levodopa (SINEMET) 25-100 mg per tablet, Take 1 tablet by mouth 4 (four) times a day., Disp: 360 tablet, Rfl: 1 .  hydrocortisone 2.5 % cream, APPLY TOPICALLY TO FACE TWICE DAILY FOR REDNESS OR SCALING OR ITCHING, Disp: , Rfl:  .  inhalational spacing device (Ace Aerosol [employer]) spcr, Use with albuterol inhaler, Disp: 1 each, Rfl: 0 .  magnesium citrate soln, Take 296 mL by mouth once., Disp: , Rfl:  .  multivit-minerals-folic acid-lycopen-lutein (CertaVite Senior) 0.4 mg-300 mcg- 250 mcg tab tablet, Take 1 tablet by mouth Once Daily., Disp: , Rfl:  .  triamcinolone (KENALOG) 0.1 % cream, APPLY TOPICALLY TO THE AFFECTED AREA TWICE DAILY, Disp: , Rfl:  .  valsartan (DIOVAN) 160 mg tablet, Take 1 tablet (160 mg total) by mouth daily., Disp: 90 tablet, Rfl: 1   Past  Medical History:  Diagnosis Date  . Emphysema of lung (CMS/HCC)   . 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 CATARACT EXTRACTION W/  INTRAOCULAR LENS IMPLANT Bilateral    Procedure: CATARACT EXTRACTION W/ INTRAOCULAR LENS IMPLANT  . COLONOSCOPY W/ BIOPSIES  03/16/2014   Procedure: COLONOSCOPY W/ BIOPSIES; [facility] - repeat in 5 yrs.  SABRA MASTOIDECTOMY Right    Procedure: MASTOIDECTOMY  . SKIN CANCER EXCISION Right 2000   Procedure: SKIN CANCER EXCISION; Arm - melanoma.    Patient Active Problem List  Diagnosis  . Dizziness and giddiness  . Chronic Eustachian tube dysfunction, bilateral  . BPPV (benign paroxysmal positional vertigo), right  . Type 2 diabetes mellitus without complication, without long-term current use of insulin (HCC)  . Hyperlipidemia LDL goal <70  . Hypertension associated with diabetes (HCC)  . Actinic keratoses  . Bilateral chronic serous otitis media  . Bilateral high frequency sensorineural hearing loss  . Aortic atherosclerosis (HCC)  . Coronary artery calcification seen on CT scan  . Centrilobular emphysema (HCC)  . Pulmonary nodule  . History of mastoidectomy  . Elevated TSH  . Wax in ear  . Bradykinesia  . Unsteady gait  . Parkinson disease  . Impacted cerumen of left ear    Allergies:  Patient has no known allergies.  A complete ROS was performed with pertinent positives and negatives noted in the HPI, and all others  were negative except for above.  BP (!) 157/107 (BP Location: Right arm, Patient Position: Sitting) Comment: wife states always runs high  Pulse 94   Wt 71.1 kg (156 lb 12.8 oz)   SpO2 96%   BMI 22.50 kg/m   Mental status is slightly abnormal: Awake, alert, oriented x 3, speech fluent, fair Memory, repetition, naming are intact. Normal fund of knowledge.  Hypophonic.   No carotid bruits.   CN 2 - 12 are normal, pupils equal, extra-occular movements full, face symmetric  with no numbness. Masked fascies. Poor hearing.   Motor: All muscle groups were 5/5 in upper and lower extremities, with no pronator drift. Tone is increased with mild cog-wheeling bilaterally and there is a slight tremor at rest..   Sensation is intact to all modalities bilaterally.   Reflexes are equal bilaterally. 2 throughout.   Cerebellar and gait are without ataxia. Gait unsteady, slow. Using rolling walker.   Results for orders placed or performed in visit on 06/29/22  Urinalysis without Microscopic  Result Value Ref Range   Color, Urine Yellow Yellow   Clarity, Urine Clear Clear   Specific Gravity, Urine 1.014 1.005 - 1.025   pH, Urine 7.0 5.0 - 8.0   Protein, Urine Negative Negative mg/dL   Glucose, Urine Negative Negative mg/dL   Ketones, Urine Negative Negative mg/dL   Bilirubin, Urine Negative Negative   Blood, Urine Negative Negative   Nitrite, Urine Negative Negative   Leukocyte Esterase, Urine Negative Negative   Urobilinogen, Urine Normal <2.0 mg/dL  TSH  Result Value Ref Range   TSH 4.409 0.450 - 5.330 uIU/mL  Comprehensive Metabolic Panel  Result Value Ref Range   Sodium 131 (L) 136 - 145 mmol/L   Potassium 4.2 3.5 - 5.1 mmol/L   Chloride 93 (L) 98 - 107 mmol/L   CO2 27 21 - 31 mmol/L   Anion Gap 11 6 - 14 mmol/L   Glucose, Random 167 (H) 70 - 99 mg/dL   Blood Urea Nitrogen (BUN) 12 7 - 25 mg/dL   Creatinine 8.93 9.29 - 1.30 mg/dL   eGFR 71 >40 fO/fpw/8.26f7   Albumin 4.5 3.5 - 5.7 g/dL   Total Protein 7.3 6.4 - 8.9 g/dL   Bilirubin, Total 1.5 (H) 0.3 - 1.0 mg/dL   Alkaline Phosphatase (ALP) 69 34 - 104 U/L   Aspartate Aminotransferase (AST) 23 13 - 39 U/L   Alanine Aminotransferase (ALT) 17 7 - 52 U/L   Calcium 9.4 8.6 - 10.3 mg/dL   BUN/Creatinine Ratio    Lipid Panel With Reflex Direct LDL  Result Value Ref Range   Cholesterol, Total, Lipid Panel 164 <200 mg/dL   Triglycerides, Lipid Panel 97 <150 mg/dL   HDL Cholesterol - Lipid Panel 63 >=60  mg/dL   LDL Cholesterol, Calculated 82 <100 mg/dL   Non-HDL Cholesterol 898 mg/dL  Hemoglobin J8R With Estimated Average Glucose  Result Value Ref Range   Hemoglobin A1c 6.7 (H) <5.7 %   Estimated Average Glucose 146 mg/dL  Vitamin B12  Result Value Ref Range   Vitamin B-12 740 180 - 914 pg/mL  Albumin, Random Urine  Result Value Ref Range   Albumin, Urine 15 Not Established mg/L   Creatinine, Urine 83 >=20 mg/dL   Albumin/Creatinine Ratio, Urine 18 0 - 30 mg/g creat  CBC with Differential  Result Value Ref Range   WBC 8.40 4.40 - 11.00 10*3/uL   RBC 4.95 4.50 - 5.90 10*6/uL   Hemoglobin 16.3 14.0 -  17.5 g/dL   Hematocrit 52.5 58.4 - 50.4 %   Mean Corpuscular Volume (MCV) 95.6 80.0 - 96.0 fL   Mean Corpuscular Hemoglobin (MCH) 32.8 27.5 - 33.2 pg   Mean Corpuscular Hemoglobin Conc (MCHC) 34.4 33.0 - 37.0 g/dL   Red Cell Distribution Width (RDW) 13.1 12.3 - 17.0 %   Platelet Count (PLT) 209 150 - 450 10*3/uL   Mean Platelet Volume (MPV) 8.1 6.8 - 10.2 fL   Neutrophils % 69 %   Lymphocytes % 22 %   Monocytes % 7 %   Eosinophils % 1 %   Basophils % 0 %   nRBC % 0 %   Neutrophils Absolute 5.90 1.80 - 7.80 10*3/uL   Lymphocytes # 1.90 1.00 - 4.80 10*3/uL   Monocytes # 0.60 0.00 - 0.80 10*3/uL   Eosinophils # 0.10 0.00 - 0.50 10*3/uL   Basophils # 0.00 0.00 - 0.20 10*3/uL   nRBC Absolute 0.00 <=0.00 10*3/uL     Head CT 07/18/21, [place]:  Patchy hypodensity in the  subcortical and periventricular white matter likely reflects sequela  of chronic white matter microangiopathy.   Status post right mastoidectomy with complete opacification of  the mastoid bowl and near-complete opacification of the middle ear  cavity. No definite ossicular erosion.      IMPRESSION:  Parkinson's Disease Bradykinesia/unsteady gait. Syncope 06/09/22, 07/18/21       PLAN:  Continue Sinemet He is having Cardiology work-up    Follow up 6 months

## 2022-08-21 NOTE — Progress Notes (Signed)
 [facility]  [place] Outpatient Consult   PCP: Kreg Lamarr Marin, [place]  Reason for consult: Syncope  Assessment & Plan:   1. Syncope, unspecified syncope type   2. 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 Type 2 diabetes mellitus without complication, without long-term current use of insulin (HCC)   4. Hypertension associated with diabetes (HCC)   5. Aortic atherosclerosis (HCC)   6. Coronary artery calcification seen on CT scan   7. Hyperlipidemia LDL goal <70    Doing well from a cardiac standpoint Infrequent events of syncope  Constant dizziness and balance issues Dyslipidemia, on therapy Hypertension, on therapy Diabetes, on therapy Aortic and coronary atherosclerosis on CT scan   It is difficult to know why he is having presyncopal spells.  His echocardiogram does show LVH but no significant obstruction from that.  Nevertheless he will be quite sensitive to volume and I talked with him about that and their efforts to keep him well-hydrated.  The symptoms do not really sound orthostatic but with Parkinson disease we might consider trying droxidopa or if he continues to have symptoms.  First I would like to get a Zio patch and see if there is any suggestion of a rhythm abnormality.   It appears as though his blood pressure and cholesterol and diabetes are all been very well-controlled. Hospital records reviewed Recent cardiac tests reviewed and summarized Recent labs reviewed and discussed External tests reviewed Prescription drug management: Cardiac meds reviewed; Continue Current medical therapy Schedule: Zio patch Continued/increased exercise recommended weight loss suggested Call if symptoms worsen   Orders  No orders of the defined types were placed in this encounter.  No follow-ups on file.  HPI    Bob Long is a 82 y.o. male with a past medical history significant for LVH, HTN, DM, Parkinson's disease who presents  at the kind request of Marin, Kreg Lamarr*  for evaluation of Syncope  He has complaints of lightheadedness and syncope This began a few years ago. It happens in the shower a lot This occurs 3 times in the last 2 years.   These episodes usually last for several minutes They are associated with complaints of no symptoms. He denies associated Chest pain, shortness of breath, diaphoresis, and palpitations. He  has Chronic complaints of  dizziness, balance problems. He  has No complaints of Chest Pain, shortness of breath, swelling. His  blood pressure has been controlled His Cholesterol has been checked recently His sugar has been controlled He does not  exercise regularly. He does not use tobacco.  No Known Allergies  Medications:   Current Outpatient Medications:  .  aspirin 81 mg EC tablet, Take 81 mg by mouth Once Daily., Disp: , Rfl:  .  atorvastatin (LIPITOR) 20 mg tablet, Take 1 tablet (20 mg total) by mouth daily., Disp: 90 tablet, Rfl: 1 .  carbidopa-levodopa (SINEMET) 25-100 mg per tablet, Take 1 tablet by mouth 4 (four) times a day., Disp: 360 tablet, Rfl: 1 .  hydrocortisone 2.5 % cream, APPLY TOPICALLY TO FACE TWICE DAILY FOR REDNESS OR SCALING OR ITCHING, Disp: , Rfl:  .  magnesium citrate soln, Take 296 mL by mouth once., Disp: , Rfl:  .  multivit-minerals-folic acid-lycopen-lutein (CertaVite Senior) 0.4 mg-300 mcg- 250 mcg tab tablet, Take 1 tablet by mouth Once Daily., Disp: , Rfl:  .  valsartan (DIOVAN) 160 mg tablet, Take 1 tablet (160 mg total) by mouth daily., Disp: 90 tablet, Rfl: 1 .  ammonium lactate 12 % crea, APPLY TOPICALLY TO THE AFFECTED AREA EVERY DAY (Patient not taking: Reported on 08/24/2022), Disp: , Rfl:  .  triamcinolone (KENALOG) 0.1 % cream, APPLY TOPICALLY TO THE AFFECTED AREA TWICE DAILY (Patient not taking: Reported on 08/24/2022), Disp: , Rfl:   Past Medical History:  Diagnosis Date  . Emphysema of lung (CMS/HCC)   . History of melanoma 2000    Rt Arm   Past Surgical History:  Procedure Laterality Date  . CATARACT EXTRACTION W/  INTRAOCULAR LENS IMPLANT Bilateral    Procedure: CATARACT EXTRACTION W/ INTRAOCULAR LENS IMPLANT  . COLONOSCOPY W/ BIOPSIES  03/16/2014   Procedure: COLONOSCOPY W/ BIOPSIES; [facility] - repeat in 5 yrs.  SABRA MASTOIDECTOMY Right    Procedure: MASTOIDECTOMY  . SKIN CANCER EXCISION Right 2000   Procedure: SKIN CANCER EXCISION; Arm - melanoma.   Social History: Social History   Tobacco Use  Smoking Status Former  . Current packs/day: 0.00  . Average packs/day: 1 pack/day for 46.0 years (46.0 ttl pk-yrs)  . Types: Cigarettes  . Start date: 03/16/1957  . Quit date: 03/17/2003  . Years since quitting: 19.4  Smokeless Tobacco Never   Social History   Substance and Sexual Activity  Alcohol Use Yes  . Alcohol/week: 7.0 standard drinks of alcohol   Social History   Substance and Sexual Activity  Drug Use Never    Family History  Problem Relation Name Age of Onset  . Diabetes Neg Hx    . Heart disease Neg Hx    . Hypertension Neg Hx    . Stroke Neg Hx    . Colon cancer Neg Hx    . Prostate cancer Neg Hx    . Parkinsonism Neg Hx    . Alzheimer's disease Neg Hx    . Seizures Neg Hx    . Dementia Neg Hx     Review of Systems: A complete review of systems was negative except for the problems as described above  Physical Examination:  VITAL SIGNS:  Vitals:   08/24/22 1440  BP: 143/87  Pulse: 91  SpO2: 96%  Weight: 70.3 kg (155 lb)  Height: 1.778 m (5' 10)    General: Comfortable in NAD Skin:  Warm & dry HEENT:  Sclera anicteric.  No obvious oropharyngeal abnormalities Resp:  Resp even and nonlabored.  Lungs sounds clear throughout CV:  S1S2 regular rate and rhythm, 2/6 SE murmur noted, No gallops or rubs Abd:  Soft, nontender, NABS Ext:  No cyanosis or edema MSK:  Moving all extremities.  No obvious limitations or significant deficits Neck:  No JVD. no carotid bruit(s) Pulses:  2+  and symmetrical upper and lower extremities.   Objective Data Reviewed During this Patient Encounter:  Test Results Echocardiogram 412/2024: HCM vs HCM mimick, probably preload depleted, no clinically significant obstruction.  The left ventricular cavity is small. 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 The left ventricle is hyperdynamic. LV ejection fraction = >70%. Nonobstructive intracavitary gradient of 22 mm Hg is present.  The left ventricular diastolic function is probably normal for age, with likely normal left atrial pressure.  The right ventricle is normal in size and function.  The atria are normal in size.  There is no significant valvular stenosis or regurgitation.  There was insufficient TR detected to calculate RV systolic pressure.  IVC size was normal.  The aortic sinus is normal size.  There is no significant change in comparison with the last study.  Labs:  Lab Results  Component Value Date   CHOL 164 06/29/2022   TRIG 97 06/29/2022   HDL 63 06/29/2022   LDLCALC 82 06/29/2022   LDLDIRECT 82 07/17/2021   Lab Results  Component Value Date   WBC 8.40 06/29/2022   HGB 16.3 06/29/2022   HCT 47.4 06/29/2022   PLT 209 06/29/2022   Lab Results  Component Value Date   NA 131 (L) 06/29/2022   K 4.2 06/29/2022   CL 93 (L) 06/29/2022   CO2 27 06/29/2022   BUN 12 06/29/2022   CREATININE 1.06 06/29/2022   GLUCOSE 167 (H) 06/29/2022   AST 23 06/29/2022   ALT 17 06/29/2022    Portions of this note were dictated using DRAGON voice recognition software. Please disregard any errors in transcription.  This record has been created using Conservation officer, historic buildings. Errors have been sought and corrected, but may not always be located. Such creation errors do not reflect on the standard of medical care.

## 2022-09-21 NOTE — Progress Notes (Signed)
 Zio patch done 09/21/2022 was unremarkable except for a few brief episodes of atrial tachycardia. Current treatment plan is effective, no change in therapy, Follow-up as documented in EMR

## 2023-05-13 NOTE — Telephone Encounter (Signed)
 error

## 2023-07-15 ENCOUNTER — Encounter: Payer: Self-pay | Admitting: Nurse Practitioner

## 2023-07-15 ENCOUNTER — Ambulatory Visit: Admitting: Nurse Practitioner

## 2023-07-15 VITALS — BP 132/78 | HR 85 | Temp 97.9°F | Resp 19 | Ht 68.0 in | Wt 164.0 lb

## 2023-07-15 DIAGNOSIS — Z87898 Personal history of other specified conditions: Secondary | ICD-10-CM

## 2023-07-15 DIAGNOSIS — F419 Anxiety disorder, unspecified: Secondary | ICD-10-CM | POA: Diagnosis not present

## 2023-07-15 DIAGNOSIS — G20A1 Parkinson's disease without dyskinesia, without mention of fluctuations: Secondary | ICD-10-CM

## 2023-07-15 DIAGNOSIS — E119 Type 2 diabetes mellitus without complications: Secondary | ICD-10-CM | POA: Insufficient documentation

## 2023-07-15 DIAGNOSIS — I10 Essential (primary) hypertension: Secondary | ICD-10-CM

## 2023-07-15 DIAGNOSIS — E785 Hyperlipidemia, unspecified: Secondary | ICD-10-CM

## 2023-07-15 DIAGNOSIS — K5901 Slow transit constipation: Secondary | ICD-10-CM

## 2023-07-15 DIAGNOSIS — E1165 Type 2 diabetes mellitus with hyperglycemia: Secondary | ICD-10-CM

## 2023-07-15 NOTE — Assessment & Plan Note (Addendum)
 followed by Neurology, taking Sinemet.

## 2023-07-15 NOTE — Assessment & Plan Note (Signed)
 TSH 4.409 06/29/22

## 2023-07-15 NOTE — Assessment & Plan Note (Signed)
 on Atorvastatin, LDL 82 06/29/22

## 2023-07-15 NOTE — Assessment & Plan Note (Signed)
 every 3-4 days BM, hard.  Adding Benefiber and MiraLax.

## 2023-07-15 NOTE — Assessment & Plan Note (Signed)
 Blood pressure is controlled, on Valsartan

## 2023-07-15 NOTE — Progress Notes (Addendum)
 Location:   [facility]   Place of Service:    Provider: Larwance Hark NP  Zamier Eggebrecht X, NP  Patient Care Team: Disaya Walt X, NP as PCP - General (Internal Medicine) Charmayne Molly, MD as Consulting Physician (Ophthalmology)  Extended Emergency Contact Information Primary Emergency Contact: [name],[name]  [place] Home Phone: [phone] Mobile Phone: [phone] Relation: Spouse Secondary Emergency Contact: [name],[name] Mobile Phone: [phone] Relation: Son Preferred language: English Interpreter needed? No  Code Status:  DNR Goals of care: Advanced Directive information    10/01/2023    1:53 PM  Advanced Directives  Does Patient Have a Medical Advance Directive? Yes  Type of Advance Directive Out of facility DNR (pink MOST or yellow form)  Does patient want to make changes to medical advance directive? No - Patient declined  Copy of Healthcare Power of Attorney in Chart? No - copy requested     Chief Complaint  Patient presents with   Establish Care    New patient appointment.    HPI:  Pt is a 83 y.o. 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 CT 07/18/21 no acute intracranial process, s/p R mastoidectomy.   Parkinson's disease, followed by Neurology, taking Sinemet .   Anxiety, TSH 4.409 06/29/22  HLD, on Atorvastatin , LDL 82 06/29/22  Constipation, every 3-4 days BM, hard.   Past Medical History:  Diagnosis Date   Diabetes mellitus without complication (HCC)    High cholesterol    Hypertension    Parkinson disease (HCC)    Past Surgical History:  Procedure Laterality Date   CATARACT EXTRACTION     MASTOIDECTOMY     VASECTOMY      No Known Allergies  Allergies as of 07/15/2023   No Known Allergies      Medication List        Accurate as of Jul 15, 2023 11:59 PM. If you  have any questions, ask your nurse or doctor.          albuterol  108 (90 Base) MCG/ACT inhaler Commonly known as: VENTOLIN  HFA Inhale 2 puffs into the lungs every 4 (four) hours as needed for wheezing or shortness of breath.   amLODipine  10 MG tablet Commonly known as: NORVASC  Take 10 mg by mouth.   aspirin  EC 81 MG tablet Take 81 mg by mouth daily.   atorvastatin  20 MG tablet Commonly known as: LIPITOR Take 20 mg by mouth daily.   carbidopa -levodopa  25-100 MG tablet Commonly known as: SINEMET  IR Take 1-2 tablets by mouth See admin instructions. Take 1 tablet by mouth in the morning & at bedtime and 2 tablets at noontime   CENTRUM MINIS MEN 50+ PO Take 1 tablet by mouth daily after supper.   polyethylene glycol 17 g packet Commonly known as: MIRALAX  / GLYCOLAX  Take 17 g by mouth daily as needed for mild constipation.        Review of Systems  Constitutional:  Negative for appetite change and fatigue.  HENT:  Negative for congestion and trouble swallowing.   Eyes:  Negative for visual disturbance.  Respiratory:  Negative for cough, shortness of breath and wheezing.   Cardiovascular:  Negative for chest pain, palpitations and leg swelling.  Gastrointestinal:  Positive for constipation. Negative for abdominal pain, nausea and vomiting.  Genitourinary:  Negative for frequency.  Musculoskeletal:  Positive for arthralgias and gait problem.    Immunization History  Administered Date(s) Administered   Influenza, High Dose Seasonal PF 11/16/2014, 11/27/2015, 12/16/2017, 12/05/2021, 12/08/2022   Moderna Covid-19 Fall Seasonal Vaccine 89yrs & older 12/08/2022   Moderna Covid-19 Vaccine Bivalent Booster 82yrs & up 12/05/2021   PFIZER Comirnaty(Gray Top)Covid-19 Tri-Sucrose Vaccine 12/29/2019, 08/16/2020   PFIZER(Purple Top)SARS-COV-2 Vaccination 04/17/2019, 05/08/2019   Pneumococcal Conjugate-13 11/13/2013   Pneumococcal Polysaccharide-23 07/07/2004, 07/11/2009    RSV,unspecified 04/09/2023   Td (Adult),5 Lf Tetanus Toxid, Preservative Free 08/07/1997   Tdap 11/26/2010   Zoster Recombinant(Shingrix) 12/04/2016, 05/13/2017   Zoster, Live 08/05/2011   Pertinent  Health Maintenance Due  Topic Date Due   INFLUENZA VACCINE  10/15/2023   HEMOGLOBIN A1C  01/27/2024   OPHTHALMOLOGY EXAM  07/25/2024   FOOT EXAM  08/11/2024      07/18/2021   10:57 AM 07/15/2023    2:57 PM  Fall Risk  Falls in the past year?  0  Was there an injury with Fall?  0  Fall Risk Category Calculator  0  (RETIRED) Patient Fall Risk Level Moderate fall risk    Patient at Risk for Falls Due to  No Fall Risks  Fall risk Follow up  Falls evaluation completed     Data saved with a previous flowsheet row definition   Functional Status Survey:    Vitals:   07/15/23 1502  BP: 132/78  Pulse: 85  Resp: 19  Temp: 97.9 F (36.6 C)  SpO2: 96%  Weight: 164 lb (74.4 kg)  Height: 5' 8 (1.727 m)   Body mass index is 24.94 kg/m. Physical Exam Vitals and nursing note reviewed.  Constitutional:      Appearance: Normal appearance.  HENT:     Head: Normocephalic and atraumatic.     Nose: Nose normal.     Mouth/Throat:     Mouth: Mucous membranes are moist.  Eyes:     Extraocular Movements: Extraocular movements intact.     Conjunctiva/sclera: Conjunctivae normal.     Pupils: Pupils are equal, round, and reactive to light.  Cardiovascular:     Rate and Rhythm: Normal rate and regular rhythm.     Heart sounds: Murmur heard.     Comments: SM left sternal boarder.  Pulmonary:     Effort: Pulmonary effort is normal.     Breath sounds: No wheezing, rhonchi or rales.  Abdominal:     General: Bowel sounds are normal.     Palpations: Abdomen is soft.     Tenderness: There is no abdominal tenderness.  Musculoskeletal:        General: No tenderness. Normal range of motion.     Cervical back: Normal range of motion and neck supple.     Right lower leg: No edema.     Left lower  leg: No edema.  Skin:    General: Skin is warm and dry.     Findings: No rash.  Neurological:     General: No focal deficit present.     Mental Status: He is alert and oriented to person, place, and time. Mental status is at baseline.     Motor: Weakness present.     Coordination: Coordination normal.     Gait: Gait abnormal.     Comments: Mild right side facial weakness.   Psychiatric:        Mood and Affect: Mood normal.        Behavior: Behavior normal.  Thought Content: Thought content normal.        Judgment: Judgment normal.     Labs reviewed: Recent Labs    09/15/23 0550 09/16/23 0525 09/17/23 0533  NA 128* 128* 130*  K 4.3 3.6 3.6  CL 98 99 96*  CO2 22 22 23   GLUCOSE 112* 110* 105*  BUN 12 10 13   CREATININE 0.86 0.77 0.88  CALCIUM  8.3* 8.4* 8.5*  MG 1.8 1.7 1.8  PHOS 2.9  --   --    Recent Labs    07/27/23 0809 08/30/23 1025 09/13/23 1538 09/14/23 0547  AST 22 26  --  19  ALT 36 11  --  7  ALKPHOS  --  64  --  68  BILITOT 1.2 1.4*  --  1.6*  PROT 6.9 6.6  --  5.7*  ALBUMIN  --  3.6 2.6* 3.2*   Recent Labs    07/27/23 0809 08/30/23 1025 09/13/23 1538 09/15/23 0550 09/16/23 0525 09/17/23 0533  WBC 9.8 9.6   < > 11.1* 11.4* 9.7  NEUTROABS 7,056 7.9*  --   --   --   --   HGB 16.3 15.5   < > 13.9 13.6 13.9  HCT 47.0 46.0   < > 39.9 38.6* 38.9*  MCV 95.3 97.3   < > 94.8 94.1 93.5  PLT 119* 179   < > 186 177 184   < > = values in this interval not displayed.   Lab Results  Component Value Date   TSH 2.059 09/14/2023   Lab Results  Component Value Date   HGBA1C 6.7 (H) 07/27/2023   Lab Results  Component Value Date   CHOL 153 07/27/2023   HDL 58 07/27/2023   LDLCALC 77 07/27/2023   TRIG 98 07/27/2023   CHOLHDL 2.6 07/27/2023    Significant Diagnostic Results in last 30 days:  ECHOCARDIOGRAM COMPLETE Result Date: 09/15/2023    ECHOCARDIOGRAM REPORT   Patient Name:   DEMITRIUS CRASS Date of Exam: 09/15/2023 Medical Rec #:  [id number]        Height:       68.0 in Accession #:    [id number]      Weight:       159.2 lb Date of Birth:  [birth year]        BSA:          1.855 m Patient Age:    83 years        BP:           148/82 mmHg Patient Gender: M               HR:           79 bpm. Exam Location:  Inpatient Procedure: 2D Echo, Color Doppler and Cardiac Doppler (Both Spectral and Color            Flow Doppler were utilized during procedure). Indications:    Cardiomyopathy Unspecified I42.9  History:        Patient has no prior history of Echocardiogram examinations.                 Risk Factors:Diabetes and Hypertension.  Sonographer:    Tinnie Gosling RDCS Referring Phys: [id number] BURGESS BROCKS AMIN IMPRESSIONS  1. Left ventricular ejection fraction, by estimation, is 65 to 70%. The left ventricle has normal function. The left ventricle has no regional wall motion abnormalities. There is mild concentric left ventricular hypertrophy. Left ventricular diastolic parameters are consistent with  Grade I diastolic dysfunction (impaired relaxation).  2. Right ventricular systolic function is mildly reduced. The right ventricular size is mildly enlarged. There is normal pulmonary artery systolic pressure. The estimated right ventricular systolic pressure is 31.3 mmHg.  3. The mitral valve is normal in structure. No evidence of mitral valve regurgitation. No evidence of mitral stenosis.  4. The aortic valve is tricuspid. There is moderate calcification of the aortic valve. Aortic valve regurgitation is trivial. Mild aortic valve stenosis. Aortic valve area, by VTI measures 1.53 cm. Aortic valve mean gradient measures 11.0 mmHg.  5. The inferior vena cava is normal in size with greater than 50% respiratory variability, suggesting right atrial pressure of 3 mmHg. FINDINGS  Left Ventricle: Left ventricular ejection fraction, by estimation, is 65 to 70%. The left ventricle has normal function. The left ventricle has no regional wall motion abnormalities. The left  ventricular internal cavity size was normal in size. There is  mild concentric left ventricular hypertrophy. Left ventricular diastolic parameters are consistent with Grade I diastolic dysfunction (impaired relaxation). Right Ventricle: The right ventricular size is mildly enlarged. No increase in right ventricular wall thickness. Right ventricular systolic function is mildly reduced. There is normal pulmonary artery systolic pressure. The tricuspid regurgitant velocity  is 2.66 m/s, and with an assumed right atrial pressure of 3 mmHg, the estimated right ventricular systolic pressure is 31.3 mmHg. Left Atrium: Left atrial size was normal in size. Right Atrium: Right atrial size was normal in size. Pericardium: There is no evidence of pericardial effusion. Mitral Valve: The mitral valve is normal in structure. There is mild calcification of the mitral valve leaflet(s). Mild mitral annular calcification. No evidence of mitral valve regurgitation. No evidence of mitral valve stenosis. Tricuspid Valve: The tricuspid valve is normal in structure. Tricuspid valve regurgitation is trivial. Aortic Valve: The aortic valve is tricuspid. There is moderate calcification of the aortic valve. Aortic valve regurgitation is trivial. Mild aortic stenosis is present. Aortic valve mean gradient measures 11.0 mmHg. Aortic valve peak gradient measures 19.4 mmHg. Aortic valve area, by VTI measures 1.53 cm. Pulmonic Valve: The pulmonic valve was normal in structure. Pulmonic valve regurgitation is trivial. Aorta: The aortic root is normal in size and structure. Venous: The inferior vena cava is normal in size with greater than 50% respiratory variability, suggesting right atrial pressure of 3 mmHg. IAS/Shunts: No atrial level shunt detected by color flow Doppler.  LEFT VENTRICLE PLAX 2D LVIDd:         3.70 cm   Diastology LVIDs:         3.00 cm   LV e' medial:    3.92 cm/s LV PW:         1.30 cm   LV E/e' medial:  14.8 LV IVS:         1.30 cm   LV e' lateral:   2.72 cm/s LVOT diam:     1.90 cm   LV E/e' lateral: 21.4 LV SV:         56 LV SV Index:   30 LVOT Area:     2.84 cm  RIGHT VENTRICLE RV S prime:     8.59 cm/s TAPSE (M-mode): 2.0 cm LEFT ATRIUM         Index LA diam:    2.70 cm 1.46 cm/m  AORTIC VALVE AV Area (Vmax):    1.17 cm AV Area (Vmean):   1.29 cm AV Area (VTI):     1.53 cm AV Vmax:  220.00 cm/s AV Vmean:          144.400 cm/s AV VTI:            0.366 m AV Peak Grad:      19.4 mmHg AV Mean Grad:      11.0 mmHg LVOT Vmax:         90.50 cm/s LVOT Vmean:        65.800 cm/s LVOT VTI:          0.198 m LVOT/AV VTI ratio: 0.54  AORTA Ao Root diam: 3.20 cm MITRAL VALVE                TRICUSPID VALVE MV Area (PHT): 2.54 cm     TR Peak grad:   28.3 mmHg MV Decel Time: 299 msec     TR Vmax:        266.00 cm/s MV E velocity: 58.10 cm/s MV A velocity: 100.00 cm/s  SHUNTS MV E/A ratio:  0.58         Systemic VTI:  0.20 m                             Systemic Diam: 1.90 cm Dalton McleanMD Electronically signed by Ezra Kanner Signature Date/Time: 09/15/2023/1:11:12 PM    Final    CT Head Wo Contrast Result Date: 09/13/2023 CLINICAL DATA:  Lightheaded with frequent falls. EXAM: CT HEAD WITHOUT CONTRAST TECHNIQUE: Contiguous axial images were obtained from the base of the skull through the vertex without intravenous contrast. RADIATION DOSE REDUCTION: This exam was performed according to the departmental dose-optimization program which includes automated exposure control, adjustment of the mA and/or kV according to patient size and/or use of iterative reconstruction technique. COMPARISON:  Jul 18, 2021 FINDINGS: Brain: There is generalized cerebral atrophy with widening of the extra-axial spaces and ventricular dilatation. There are areas of decreased attenuation within the white matter tracts of the supratentorial brain, consistent with microvascular disease changes. Vascular: No hyperdense vessel or unexpected calcification.  Skull: Normal. Negative for fracture or focal lesion. Sinuses/Orbits: There is mild anterior left ethmoid sinus mucosal thickening. Postoperative changes consistent with prior right mastoidectomy are noted. Other: None. IMPRESSION: 1. 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 2. No acute intracranial abnormality. 3. Evidence of prior right mastoidectomy. Electronically Signed   By: Suzen Dials M.D.   On: 09/13/2023 18:35   DG Chest Port 1 View Result Date: 09/13/2023 CLINICAL DATA:  Shortness of breath EXAM: PORTABLE CHEST 1 VIEW COMPARISON:  08/30/2023 FINDINGS: No acute airspace disease or effusion. Stable cardiomediastinal silhouette with aortic atherosclerosis. No pneumothorax. Probable calcified granuloma in the left lower lung. IMPRESSION: No active disease. Electronically Signed   By: Luke Bun M.D.   On: 09/13/2023 15:53    Assessment/Plan  Parkinson's disease (HCC)  followed by Neurology, taking Sinemet .   Anxiety TSH 4.409 06/29/22  HLD (hyperlipidemia) on Atorvastatin , LDL 82 06/29/22  History of syncope evaluated by Cardiology, unremarkable workups.   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  Adding Benefiber and MiraLax .    Family/ staff Communication: plan of care reviewed with the patient  Labs/tests ordered:  CBC/diff, CMP/eGFR, TSH, lipids, Vit D, Vit B12, Hgb A1c.

## 2023-07-15 NOTE — Assessment & Plan Note (Signed)
 evaluated by Cardiology, unremarkable workups.

## 2023-07-15 NOTE — Assessment & Plan Note (Signed)
 diet controlled, Hgb A1c 6.7 06/29/22

## 2023-07-26 LAB — HM DIABETES EYE EXAM

## 2023-07-27 ENCOUNTER — Other Ambulatory Visit

## 2023-07-27 ENCOUNTER — Ambulatory Visit: Payer: Self-pay | Admitting: Nurse Practitioner

## 2023-07-31 LAB — CBC WITH DIFFERENTIAL/PLATELET
Absolute Lymphocytes: 1617 {cells}/uL (ref 850–3900)
Absolute Monocytes: 784 {cells}/uL (ref 200–950)
Basophils Absolute: 49 {cells}/uL (ref 0–200)
Basophils Relative: 0.5 %
Eosinophils Absolute: 294 {cells}/uL (ref 15–500)
Eosinophils Relative: 3 %
HCT: 47 % (ref 38.5–50.0)
Hemoglobin: 16.3 g/dL (ref 13.2–17.1)
MCH: 33.1 pg — ABNORMAL HIGH (ref 27.0–33.0)
MCHC: 34.7 g/dL (ref 32.0–36.0)
MCV: 95.3 fL (ref 80.0–100.0)
MPV: 10.9 fL (ref 7.5–12.5)
Monocytes Relative: 8 %
Neutro Abs: 7056 {cells}/uL (ref 1500–7800)
Neutrophils Relative %: 72 %
Platelets: 119 10*3/uL — ABNORMAL LOW (ref 140–400)
RBC: 4.93 10*6/uL (ref 4.20–5.80)
RDW: 12.3 % (ref 11.0–15.0)
Total Lymphocyte: 16.5 %
WBC: 9.8 10*3/uL (ref 3.8–10.8)

## 2023-07-31 LAB — COMPLETE METABOLIC PANEL WITHOUT GFR
AG Ratio: 1.6 (calc) (ref 1.0–2.5)
ALT: 36 U/L (ref 9–46)
AST: 22 U/L (ref 10–35)
Albumin: 4.2 g/dL (ref 3.6–5.1)
Alkaline phosphatase (APISO): 65 U/L (ref 35–144)
BUN: 15 mg/dL (ref 7–25)
CO2: 25 mmol/L (ref 20–32)
Calcium: 9.4 mg/dL (ref 8.6–10.3)
Chloride: 95 mmol/L — ABNORMAL LOW (ref 98–110)
Creat: 1 mg/dL (ref 0.70–1.22)
Globulin: 2.7 g/dL (ref 1.9–3.7)
Glucose, Bld: 161 mg/dL — ABNORMAL HIGH (ref 65–99)
Potassium: 4.8 mmol/L (ref 3.5–5.3)
Sodium: 132 mmol/L — ABNORMAL LOW (ref 135–146)
Total Bilirubin: 1.2 mg/dL (ref 0.2–1.2)
Total Protein: 6.9 g/dL (ref 6.1–8.1)

## 2023-07-31 LAB — LIPID PANEL
Cholesterol: 153 mg/dL (ref <200)
HDL: 58 mg/dL (ref >=40)
LDL Cholesterol (Calc): 77 mg/dL
Non-HDL Cholesterol (Calc): 95 mg/dL (ref <130)
Total CHOL/HDL Ratio: 2.6 (calc) (ref <5.0)
Triglycerides: 98 mg/dL (ref <150)

## 2023-07-31 LAB — HEMOGLOBIN A1C
Hgb A1c MFr Bld: 6.7 % — ABNORMAL HIGH (ref <5.7)
Mean Plasma Glucose: 146 mg/dL
eAG (mmol/L): 8.1 mmol/L

## 2023-07-31 LAB — TSH: TSH: 4.11 m[IU]/L (ref 0.40–4.50)

## 2023-07-31 LAB — VITAMIN D 1,25 DIHYDROXY
Vitamin D 1, 25 (OH)2 Total: 37 pg/mL (ref 18–72)
Vitamin D2 1, 25 (OH)2: <8 pg/mL
Vitamin D3 1, 25 (OH)2: 37 pg/mL

## 2023-07-31 LAB — VITAMIN B12: Vitamin B-12: 872 pg/mL (ref 200–1100)

## 2023-08-02 ENCOUNTER — Encounter: Payer: Self-pay | Admitting: Nurse Practitioner

## 2023-08-02 DIAGNOSIS — E871 Hypo-osmolality and hyponatremia: Secondary | ICD-10-CM | POA: Insufficient documentation

## 2023-08-12 ENCOUNTER — Encounter: Payer: Self-pay | Admitting: Nurse Practitioner

## 2023-08-12 ENCOUNTER — Non-Acute Institutional Stay: Admitting: Nurse Practitioner

## 2023-08-12 VITALS — BP 124/72 | HR 96 | Temp 98.6°F | Ht 68.0 in | Wt 163.0 lb

## 2023-08-12 DIAGNOSIS — E871 Hypo-osmolality and hyponatremia: Secondary | ICD-10-CM

## 2023-08-12 DIAGNOSIS — I10 Essential (primary) hypertension: Secondary | ICD-10-CM | POA: Diagnosis not present

## 2023-08-12 DIAGNOSIS — F419 Anxiety disorder, unspecified: Secondary | ICD-10-CM

## 2023-08-12 DIAGNOSIS — E785 Hyperlipidemia, unspecified: Secondary | ICD-10-CM

## 2023-08-12 DIAGNOSIS — E1165 Type 2 diabetes mellitus with hyperglycemia: Secondary | ICD-10-CM | POA: Diagnosis not present

## 2023-08-12 DIAGNOSIS — Z87898 Personal history of other specified conditions: Secondary | ICD-10-CM | POA: Diagnosis not present

## 2023-08-12 DIAGNOSIS — K5901 Slow transit constipation: Secondary | ICD-10-CM

## 2023-08-12 DIAGNOSIS — G20A1 Parkinson's disease without dyskinesia, without mention of fluctuations: Secondary | ICD-10-CM

## 2023-08-12 NOTE — Patient Instructions (Addendum)
 1.) Visit your local pharmacy to receive your tetanus vaccine if you have not already.   2.) You have a non-fasting lab appointment on June 19th @ 7am at [facility].

## 2023-08-12 NOTE — Assessment & Plan Note (Signed)
 Mild, Na 132 07/27/23, monitor.

## 2023-08-12 NOTE — Assessment & Plan Note (Signed)
 Diet controlled, baseline bowel habit every 3-4 days BM, hard.

## 2023-08-12 NOTE — Assessment & Plan Note (Signed)
 diet controlled, Hgb A1c 6.7 07/27/23

## 2023-08-12 NOTE — Progress Notes (Signed)
 Location:   Clinic [facility]   Place of Service:  Clinic (12) Provider: Kerman Peck NP  Code Status: DNR Goals of Care:     07/15/2023    2:58 PM  Advanced Directives  Does Patient Have a Medical Advance Directive? Yes  Type of Estate agent of [place];Living will  Does patient want to make changes to medical advance directive? No - Patient declined  Copy of Healthcare Power of Attorney in Chart? No - copy requested     Chief Complaint  Patient presents with   Medical Management of Chronic Issues    Routine follow up visit and foot exam.  Discuss the need for the following vaccines:  TD Diabetic kidney eval  Pt also needs AWV  Eye exam (requested results)     HPI: Patient is a 83 y.o. male seen today for managing chronic medication conditions.   T2DM, diet controlled, Hgb A1c 6.7 07/27/23             HTN, on Valsartan, K 4.8, Bun/creat 15/1.0 07/27/23, no significant orthostatic blood pressure, HR changes.              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 CT 07/18/21 no acute intracranial process, s/p R mastoidectomy.              Parkinson's disease, followed by Neurology, taking Sinemet.              Anxiety, TSH 4.11, Vit B 872, Vit D 37 07/27/23             HLD, on Atorvastatin, LDL 77 07/27/23             Constipation, every 3-4 days BM, hard, prn MiraLax.  Hyponatremia, Na 132 07/27/23   Past Medical History:  Diagnosis Date   Diabetes mellitus without complication (HCC)    High cholesterol    Hypertension    Parkinson disease (HCC)     Past Surgical History:  Procedure Laterality Date   CATARACT EXTRACTION     MASTOIDECTOMY     VASECTOMY      No Known Allergies  Allergies as of 08/12/2023   No Known Allergies      Medication List        Accurate as of Aug 12, 2023  4:32 PM. If you have any questions, ask your nurse or doctor.          albuterol 108 (90 Base)  MCG/ACT inhaler Commonly known as: VENTOLIN HFA Inhale 2 puffs into the lungs every 4 (four) hours as needed.   amLODipine 10 MG tablet Commonly known as: NORVASC Take 10 mg by mouth.   aspirin EC 81 MG tablet Take 81 mg by mouth.   atorvastatin 20 MG tablet Commonly known as: LIPITOR Take 20 mg by mouth daily.   carbidopa-levodopa 25-100 MG tablet Commonly known as: SINEMET IR Take 1 tablet by mouth 4 (four) times daily.   CENTRUM MINIS MEN 50+ PO Take by mouth daily.   polyethylene glycol 17 g packet Commonly known as: MIRALAX / GLYCOLAX Take 17 g by mouth daily.        Review of Systems:  Review of Systems  Constitutional:  Negative for appetite change and fatigue.  HENT:  Negative for congestion and trouble swallowing.   Eyes:  Negative for visual disturbance.  Respiratory:  Negative for cough,  shortness of breath and wheezing.   Cardiovascular:  Negative for chest pain, palpitations and leg swelling.  Gastrointestinal:  Positive for constipation. Negative for abdominal pain, nausea and vomiting.  Genitourinary:  Negative for frequency.  Musculoskeletal:  Positive for arthralgias and gait problem.    Health Maintenance  Topic Date Due   OPHTHALMOLOGY EXAM  Never done   Diabetic kidney evaluation - Urine ACR  Never done   DTaP/Tdap/Td (2 - Td or Tdap) 11/25/2020   Medicare Annual Wellness (AWV)  06/26/2023   COVID-19 Vaccine (7 - Pfizer risk 2024-25 season) 12/14/2023 (Originally 06/07/2023)   INFLUENZA VACCINE  10/15/2023   HEMOGLOBIN A1C  01/27/2024   Diabetic kidney evaluation - eGFR measurement  07/26/2024   FOOT EXAM  08/11/2024   Pneumonia Vaccine 51+ Years old  Completed   Zoster Vaccines- Shingrix  Completed   HPV VACCINES  Aged Out   Meningococcal B Vaccine  Aged Out    Physical Exam: Vitals:   08/12/23 0926 08/12/23 1418 08/12/23 1419 08/12/23 1420  BP: 124/72     Pulse: 96     Temp: 98.6 F (37 C)     SpO2: 96% 97% 98% 97%  Height: 5\' 8"   (1.727 m)      Body mass index is 24.94 kg/m. Physical Exam Vitals and nursing note reviewed.  Constitutional:      Appearance: Normal appearance.  HENT:     Head: Normocephalic and atraumatic.     Nose: Nose normal.     Mouth/Throat:     Mouth: Mucous membranes are moist.  Eyes:     Extraocular Movements: Extraocular movements intact.     Conjunctiva/sclera: Conjunctivae normal.     Pupils: Pupils are equal, round, and reactive to light.  Cardiovascular:     Rate and Rhythm: Normal rate and regular rhythm.     Heart sounds: Murmur heard.     Comments: SM left sternal boarder.  Pulmonary:     Effort: Pulmonary effort is normal.     Breath sounds: No wheezing, rhonchi or rales.  Abdominal:     General: Bowel sounds are normal.     Palpations: Abdomen is soft.     Tenderness: There is no abdominal tenderness.  Musculoskeletal:        General: No tenderness. Normal range of motion.     Cervical back: Normal range of motion and neck supple.     Right lower leg: No edema.     Left lower leg: No edema.  Skin:    General: Skin is warm and dry.     Findings: No rash.  Neurological:     General: No focal deficit present.     Mental Status: He is alert and oriented to person, place, and time. Mental status is at baseline.     Motor: Weakness present.     Coordination: Coordination normal.     Gait: Gait abnormal.     Comments: Mild right side facial weakness.   Psychiatric:        Mood and Affect: Mood normal.        Behavior: Behavior normal.        Thought Content: Thought content normal.        Judgment: Judgment normal.     Labs reviewed: Basic Metabolic Panel: Recent Labs    07/27/23 0809  NA 132*  K 4.8  CL 95*  CO2 25  GLUCOSE 161*  BUN 15  CREATININE 1.00  CALCIUM 9.4  TSH  4.11   Liver Function Tests: Recent Labs    07/27/23 0809  AST 22  ALT 36  BILITOT 1.2  PROT 6.9   No results for input(s): "LIPASE", "AMYLASE" in the last 8760 hours. No  results for input(s): "AMMONIA" in the last 8760 hours. CBC: Recent Labs    07/27/23 0809  WBC 9.8  NEUTROABS 7,056  HGB 16.3  HCT 47.0  MCV 95.3  PLT 119*   Lipid Panel: Recent Labs    07/27/23 0809  CHOL 153  HDL 58  LDLCALC 77  TRIG 98  CHOLHDL 2.6   Lab Results  Component Value Date   HGBA1C 6.7 (H) 07/27/2023    Procedures since last visit: CT HEAD WO CONTRAST Result Date: 07/18/2021 CLINICAL DATA:  Loss of consciousness for 2-3 minutes last Monday EXAM: CT HEAD WITHOUT CONTRAST TECHNIQUE: Contiguous axial images were obtained from the base of the skull through the vertex without intravenous contrast. RADIATION DOSE REDUCTION: This exam was performed according to the departmental dose-optimization program which includes automated exposure control, adjustment of the mA and/or kV according to patient size and/or use of iterative reconstruction technique. COMPARISON:  None Available. FINDINGS: Brain: There is no acute intracranial hemorrhage, extra-axial fluid collection, or acute infarct. Parenchymal volume is normal. The ventricles are normal in size. Gray-white differentiation is preserved. Patchy hypodensity in the subcortical and periventricular white matter likely reflects sequela of chronic white matter microangiopathy. There is no mass lesion.  There is no mass effect or midline shift. Vascular: There is calcification of the bilateral cavernous ICAs. Skull: Normal. Negative for fracture or focal lesion. Sinuses/Orbits: There is moderate mucosal thickening in the left frontal sinus. Bilateral lens implants are in place. The globes and orbits are otherwise unremarkable. Other: Postsurgical changes reflecting right canal wall up mastoidectomy are noted. There is soft tissue density completely opacifying the mastoid bowl and nearly completely opacifying the middle ear cavity. There is no definite ossicular erosion. There is trace fluid in the left mastoid tip. IMPRESSION: 1. No  acute intracranial pathology. 2. Status post right mastoidectomy with complete opacification of the mastoid bowl and near-complete opacification of the middle ear cavity. No definite ossicular erosion. Electronically Signed   By: Eldora Greet M.D.   On: 07/18/2021 11:37   DG Chest Portable 1 View Result Date: 07/18/2021 CLINICAL DATA:  Altered mental status EXAM: PORTABLE CHEST 1 VIEW COMPARISON:  06/05/2019 FINDINGS: Stable cardiomediastinal contours. Aortic atherosclerosis. No focal airspace consolidation, pleural effusion, or pneumothorax. IMPRESSION: No active disease. Electronically Signed   By: Leverne Reading D.O.   On: 07/18/2021 11:26    Assessment/Plan Hyponatremia Mild, Na 132 07/27/23, monitor.   Type 2 diabetes mellitus (HCC) diet controlled, Hgb A1c 6.7 07/27/23  HTN (hypertension) Blood pressure is controlled,  on Valsartan, K 4.8, Bun/creat 15/1.0 07/27/23  History of syncope No recurrence since last seen, evaluated by Cardiology, unremarkable workups. CT 07/18/21 no acute intracranial process, s/p R mastoidectomy.  on Valsartan, K 4.8, Bun/creat 15/1.0 07/27/23, no significant orthostatic blood pressure, HR changes.  ENT or neurology follow up if desires.   Parkinson's disease (HCC) followed by Neurology, taking Sinemet. Ambulates with walker, not disabling.   Anxiety Stable,  TSH 4.11, Vit B 872, Vit D 37 07/27/23  HLD (hyperlipidemia) on Atorvastatin, LDL 77 07/27/23  Slow transit constipation Diet controlled, baseline bowel habit every 3-4 days BM, hard.     Labs/tests ordered:  ACR  Next appt:  5 months.

## 2023-08-12 NOTE — Assessment & Plan Note (Addendum)
 No recurrence since last seen, evaluated by Cardiology, unremarkable workups. CT 07/18/21 no acute intracranial process, s/p R mastoidectomy.  on Valsartan, K 4.8, Bun/creat 15/1.0 07/27/23, no significant orthostatic blood pressure, HR changes.  ENT or neurology follow up if desires.

## 2023-08-12 NOTE — Assessment & Plan Note (Signed)
 on Atorvastatin, LDL 77 07/27/23

## 2023-08-12 NOTE — Assessment & Plan Note (Signed)
 followed by Neurology, taking Sinemet. Ambulates with walker, not disabling.

## 2023-08-12 NOTE — Assessment & Plan Note (Signed)
 Blood pressure is controlled,  on Valsartan, K 4.8, Bun/creat 15/1.0 07/27/23

## 2023-08-12 NOTE — Assessment & Plan Note (Signed)
 Stable,  TSH 4.11, Vit B 872, Vit D 37 07/27/23

## 2023-08-16 NOTE — Progress Notes (Signed)
 Chief Complaint  Patient presents with  . Parkinson's Disease    Patient returns for assessment of hx parkinson's disease and discuss current medication plan. Pt reports no changes since last visit.   Accompanied by: wife  Depression screening score: 9 Pain level  0/10      The patient is a 83 y.o. right handed White or Caucasian   Male  With unsteady gait, uses walker, has fallen, not since last visit, bradykinesia, Parkinson's disease.  Tremor not bad, about the same,  Gets sleepy. Weak all over/fatigue, sleeps a lot. Loss of consciousness 07/18/21 in shower, syncope. Has passed out again Jul 09, 2022, getting Cardiology work-up.Memory not bad..Some dizziness. Loss of smell recently. He had physical and occupational therapy which helped.. Sinemet makes him sleepy, sleeps a lot during the day. Doesn't snore..   Some dyspnea.   The patient denies headaches, weakness,  numbness or tingling, nausea, vomiting, seizures, recent trauma.   Current Outpatient Medications:  .  aspirin 81 mg EC tablet, Take 81 mg by mouth Once Daily., Disp: , Rfl:  .  atorvastatin (LIPITOR) 20 mg tablet, Take 1 tablet (20 mg total) by mouth daily., Disp: 90 tablet, Rfl: 3 .  carbidopa-levodopa (SINEMET) 25-100 mg per tablet, TAKE 1 TABLET BY MOUTH FOUR TIMES DAILY, Disp: 360 tablet, Rfl: 1 .  hydrocortisone 2.5 % cream, , Disp: , Rfl:  .  magnesium citrate soln, Take 296 mL by mouth once., Disp: , Rfl:  .  MELATONIN ORAL, Take 10 mg by mouth nightly as needed., Disp: , Rfl:  .  multivit-minerals-folic acid-lycopen-lutein (CertaVite Senior) 0.4 mg-300 mcg- 250 mcg tab tablet, Take 1 tablet by mouth Once Daily., Disp: , Rfl:  .  valsartan (DIOVAN) 160 mg tablet, Take 1 tablet (160 mg total) by mouth daily., Disp: 90 tablet, Rfl: 3   Past Medical History:  Diagnosis Date  . Emphysema of lung    (CMD)   . 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 HL (hearing loss)   . Parkinson disease    (CMD)      Past Surgical  History:  Procedure Laterality Date  . CATARACT EXTRACTION W/  INTRAOCULAR LENS IMPLANT Bilateral    Procedure: CATARACT EXTRACTION W/ INTRAOCULAR LENS IMPLANT  . COLONOSCOPY W/ BIOPSIES  03/16/2014   Procedure: COLONOSCOPY W/ BIOPSIES; Jenetta - repeat in 5 yrs.  SABRA MASTOIDECTOMY Right    Procedure: MASTOIDECTOMY  . SKIN CANCER EXCISION Right 2000   Procedure: SKIN CANCER EXCISION; Arm - melanoma.    Patient Active Problem List  Diagnosis  . Dizziness and giddiness  . Chronic Eustachian tube dysfunction, bilateral  . BPPV (benign paroxysmal positional vertigo), right  . Type 2 diabetes mellitus without complication, without long-term current use of insulin (HCC)  . Hyperlipidemia LDL goal <70  . Hypertension associated with diabetes (HCC)  . Actinic keratoses  . Bilateral chronic serous otitis media  . Bilateral high frequency sensorineural hearing loss  . Aortic atherosclerosis (HCC)  . Coronary artery calcification seen on CT scan  . Centrilobular emphysema (HCC)  . Pulmonary nodule  . History of mastoidectomy  . Elevated TSH  . Wax in ear  . Bradykinesia  . Unsteady gait  . Impacted cerumen of left ear  . Parkinson's disease    (CMD)  . Hearing loss of left ear    Allergies:  Patient has no known allergies.  A complete ROS was performed with pertinent positives and negatives noted in the HPI, and all  others were negative except for above.  BP 122/74 (BP Location: Right arm, Patient Position: Sitting)   Pulse 94   Ht 1.778 m (5' 10)   Wt 73.5 kg (162 lb 1.6 oz)   SpO2 97%   BMI 23.26 kg/m   Mental status is slightly abnormal: Awake, alert, oriented x 3, speech fluent, fair Memory, repetition, naming are intact. Normal fund of knowledge.  Hypophonic.   No carotid bruits.   CN 2 - 12 are normal, pupils equal, extra-occular movements full, face symmetric with no numbness. Masked fascies. Poor hearing.   Motor: All muscle groups were 5/5 in upper and lower  extremities, with no pronator drift. Tone is increased with mild cog-wheeling bilaterally and there is a slight tremor at rest..   Sensation is intact to all modalities bilaterally.   Reflexes are equal bilaterally. 2 throughout.   Cerebellar and gait are without ataxia. Gait unsteady, slow. Using rolling walker.   Results for orders placed or performed in visit on 06/29/22  Urinalysis without Microscopic   Collection Time: 06/29/22  8:55 AM  Result Value Ref Range   Color, Urine Yellow Yellow   Clarity, Urine Clear Clear   Specific Gravity, Urine 1.014 1.005 - 1.025   pH, Urine 7.0 5.0 - 8.0   Protein, Urine Negative Negative mg/dL   Glucose, Urine Negative Negative mg/dL   Ketones, Urine Negative Negative mg/dL   Bilirubin, Urine Negative Negative   Blood, Urine Negative Negative   Nitrite, Urine Negative Negative   Leukocyte Esterase, Urine Negative Negative   Urobilinogen, Urine Normal <2.0 mg/dL  TSH   Collection Time: 06/29/22  8:55 AM  Result Value Ref Range   TSH 4.409 0.450 - 5.330 uIU/mL  Comprehensive Metabolic Panel   Collection Time: 06/29/22  8:55 AM  Result Value Ref Range   Sodium 131 (L) 136 - 145 mmol/L   Potassium 4.2 3.5 - 5.1 mmol/L   Chloride 93 (L) 98 - 107 mmol/L   CO2 27 21 - 31 mmol/L   Anion Gap 11 6 - 14 mmol/L   Glucose, Random 167 (H) 70 - 99 mg/dL   Blood Urea Nitrogen (BUN) 12 7 - 25 mg/dL   Creatinine 8.93 9.29 - 1.30 mg/dL   eGFR 71 >40 fO/fpw/8.26f7   Albumin 4.5 3.5 - 5.7 g/dL   Total Protein 7.3 6.4 - 8.9 g/dL   Bilirubin, Total 1.5 (H) 0.3 - 1.0 mg/dL   Alkaline Phosphatase (ALP) 69 34 - 104 U/L   Aspartate Aminotransferase (AST) 23 13 - 39 U/L   Alanine Aminotransferase (ALT) 17 7 - 52 U/L   Calcium 9.4 8.6 - 10.3 mg/dL   BUN/Creatinine Ratio    Lipid Panel With Reflex Direct LDL   Collection Time: 06/29/22  8:55 AM  Result Value Ref Range   Cholesterol, Total, Lipid Panel 164 <200 mg/dL   Triglycerides, Lipid Panel 97 <150  mg/dL   HDL Cholesterol - Lipid Panel 63 >=60 mg/dL   LDL Cholesterol, Calculated 82 <100 mg/dL   Non-HDL Cholesterol 898 mg/dL  Hemoglobin J8R With Estimated Average Glucose   Collection Time: 06/29/22  8:55 AM  Result Value Ref Range   Hemoglobin A1c 6.7 (H) <5.7 %   Estimated Average Glucose 146 mg/dL  Vitamin B12   Collection Time: 06/29/22  8:55 AM  Result Value Ref Range   Vitamin B-12 740 180 - 914 pg/mL  Albumin, Random Urine   Collection Time: 06/29/22  8:55 AM  Result Value Ref  Range   Albumin, Urine 15 Not Established mg/L   Creatinine, Urine 83 >=20 mg/dL   Albumin/Creatinine Ratio, Urine 18 0 - 30 mg/g creat  CBC with Differential   Collection Time: 06/29/22  8:55 AM  Result Value Ref Range   WBC 8.40 4.40 - 11.00 10*3/uL   RBC 4.95 4.50 - 5.90 10*6/uL   Hemoglobin 16.3 14.0 - 17.5 g/dL   Hematocrit 52.5 58.4 - 50.4 %   Mean Corpuscular Volume (MCV) 95.6 80.0 - 96.0 fL   Mean Corpuscular Hemoglobin (MCH) 32.8 27.5 - 33.2 pg   Mean Corpuscular Hemoglobin Conc (MCHC) 34.4 33.0 - 37.0 g/dL   Red Cell Distribution Width (RDW) 13.1 12.3 - 17.0 %   Platelet Count (PLT) 209 150 - 450 10*3/uL   Mean Platelet Volume (MPV) 8.1 6.8 - 10.2 fL   Neutrophils % 69 %   Lymphocytes % 22 %   Monocytes % 7 %   Eosinophils % 1 %   Basophils % 0 %   nRBC % 0 %   Neutrophils Absolute 5.90 1.80 - 7.80 10*3/uL   Lymphocytes # 1.90 1.00 - 4.80 10*3/uL   Monocytes # 0.60 0.00 - 0.80 10*3/uL   Eosinophils # 0.10 0.00 - 0.50 10*3/uL   Basophils # 0.00 0.00 - 0.20 10*3/uL   nRBC Absolute 0.00 <=0.00 10*3/uL   Head CT 07/18/21, [place]:  Patchy hypodensity in the  subcortical and periventricular white matter likely reflects sequela  of chronic white matter microangiopathy.   Status post right mastoidectomy with complete opacification of  the mastoid bowl and near-complete opacification of the middle ear  cavity. No definite ossicular erosion.     IMPRESSION:  Parkinson's  Disease Bradykinesia/unsteady gait. Syncope 06/09/22, 07/18/21     PLAN:    Continue Sinemet qid     Follow up 6 months

## 2023-08-30 ENCOUNTER — Emergency Department (HOSPITAL_COMMUNITY)

## 2023-08-30 ENCOUNTER — Emergency Department (HOSPITAL_COMMUNITY)
Admission: EM | Admit: 2023-08-30 | Discharge: 2023-08-30 | Disposition: A | Discharge status: Home / Self Care | Attending: Emergency Medicine | Admitting: Emergency Medicine

## 2023-08-30 ENCOUNTER — Encounter (HOSPITAL_COMMUNITY): Payer: Self-pay

## 2023-08-30 ENCOUNTER — Other Ambulatory Visit: Payer: Self-pay

## 2023-08-30 DIAGNOSIS — R55 Syncope and collapse: Secondary | ICD-10-CM | POA: Insufficient documentation

## 2023-08-30 DIAGNOSIS — E871 Hypo-osmolality and hyponatremia: Secondary | ICD-10-CM | POA: Diagnosis not present

## 2023-08-30 DIAGNOSIS — Z7982 Long term (current) use of aspirin: Secondary | ICD-10-CM | POA: Diagnosis not present

## 2023-08-30 LAB — COMPREHENSIVE METABOLIC PANEL WITH GFR
ALT: 11 U/L (ref 0–44)
AST: 26 U/L (ref 15–41)
Albumin: 3.6 g/dL (ref 3.5–5.0)
Alkaline Phosphatase: 64 U/L (ref 38–126)
Anion gap: 13 (ref 5–15)
BUN: 15 mg/dL (ref 8–23)
CO2: 22 mmol/L (ref 22–32)
Calcium: 8.7 mg/dL — ABNORMAL LOW (ref 8.9–10.3)
Chloride: 95 mmol/L — ABNORMAL LOW (ref 98–111)
Creatinine, Ser: 1.23 mg/dL (ref 0.61–1.24)
GFR, Estimated: 59 mL/min — ABNORMAL LOW (ref >60)
Glucose, Bld: 266 mg/dL — ABNORMAL HIGH (ref 70–99)
Potassium: 4 mmol/L (ref 3.5–5.1)
Sodium: 130 mmol/L — ABNORMAL LOW (ref 135–145)
Total Bilirubin: 1.4 mg/dL — ABNORMAL HIGH (ref 0.0–1.2)
Total Protein: 6.6 g/dL (ref 6.5–8.1)

## 2023-08-30 LAB — CBC WITH DIFFERENTIAL/PLATELET
Abs Immature Granulocytes: 0.04 10*3/uL (ref 0.00–0.07)
Basophils Absolute: 0.1 10*3/uL (ref 0.0–0.1)
Basophils Relative: 1 %
Eosinophils Absolute: 0.1 10*3/uL (ref 0.0–0.5)
Eosinophils Relative: 1 %
HCT: 46 % (ref 39.0–52.0)
Hemoglobin: 15.5 g/dL (ref 13.0–17.0)
Immature Granulocytes: 0 %
Lymphocytes Relative: 9 %
Lymphs Abs: 0.8 10*3/uL (ref 0.7–4.0)
MCH: 32.8 pg (ref 26.0–34.0)
MCHC: 33.7 g/dL (ref 30.0–36.0)
MCV: 97.3 fL (ref 80.0–100.0)
Monocytes Absolute: 0.7 10*3/uL (ref 0.1–1.0)
Monocytes Relative: 7 %
Neutro Abs: 7.9 10*3/uL — ABNORMAL HIGH (ref 1.7–7.7)
Neutrophils Relative %: 82 %
Platelets: 179 10*3/uL (ref 150–400)
RBC: 4.73 MIL/uL (ref 4.22–5.81)
RDW: 12.7 % (ref 11.5–15.5)
WBC: 9.6 10*3/uL (ref 4.0–10.5)
nRBC: 0 % (ref 0.0–0.2)

## 2023-08-30 LAB — CBG MONITORING, ED: Glucose-Capillary: 252 mg/dL — ABNORMAL HIGH (ref 70–99)

## 2023-08-30 MED ORDER — SODIUM CHLORIDE 0.9 % IV SOLN: INTRAVENOUS | Status: DC

## 2023-08-30 NOTE — ED Provider Notes (Signed)
 [place] EMERGENCY DEPARTMENT AT [facility] Provider Note   CSN: [id number] Arrival date & time: 08/30/23  1009     Patient presents with: Near Syncope   Bob Long is a 83 y.o. male.   HPI Patient presents from his nursing facility after episode of possible syncope. Patient has no current complaints, including dyspnea, chest pain, head pain. He states that he was in the shower, felt lightheaded, slid to the ground.  He has a history of Parkinson's disease, anxiety, hypertension.  He notes no recent medication changes.  EMS reports no hemodynamic instability in transport.  Though he had positive orthostatic evaluation.    Prior to Admission medications   Medication Sig Start Date End Date Taking? Authorizing Provider  albuterol (VENTOLIN HFA) 108 (90 Base) MCG/ACT inhaler Inhale 2 puffs into the lungs every 4 (four) hours as needed. 01/02/20   [provider]  amLODipine (NORVASC) 10 MG tablet Take 10 mg by mouth. 01/01/16   [provider]  aspirin EC 81 MG tablet Take 81 mg by mouth. 04/18/15   [provider]  atorvastatin (LIPITOR) 20 MG tablet Take 20 mg by mouth daily.    [provider]  carbidopa-levodopa (SINEMET IR) 25-100 MG tablet Take 1 tablet by mouth 4 (four) times daily.    [provider]  Multiple Vitamins-Minerals (CENTRUM MINIS MEN 50+ PO) Take by mouth daily.    [provider]  polyethylene glycol (MIRALAX / GLYCOLAX) 17 g packet Take 17 g by mouth daily.    [provider]    Allergies: Patient has no known allergies.    Review of Systems  Updated Vital Signs BP (!) 179/98   Pulse 91   Temp 98.5 F (36.9 C) (Oral)   Resp 16   SpO2 97%   Physical Exam Vitals and nursing note reviewed.  Constitutional:      General: He is not in acute distress.    Appearance: He is well-developed. He is ill-appearing.  HENT:     Head: Normocephalic and atraumatic.   Eyes:      Conjunctiva/sclera: Conjunctivae normal.    Cardiovascular:     Rate and Rhythm: Normal rate and regular rhythm.  Pulmonary:     Effort: Pulmonary effort is normal. No respiratory distress.     Breath sounds: No stridor.  Abdominal:     General: There is no distension.   Skin:    General: Skin is warm and dry.   Neurological:     Mental Status: He is alert and oriented to person, place, and time.     Motor: Tremor and atrophy present.     (all labs ordered are listed, but only abnormal results are displayed) Labs Reviewed  COMPREHENSIVE METABOLIC PANEL WITH GFR - Abnormal; Notable for the following components:      Result Value   Sodium 130 (*)    Chloride 95 (*)    Glucose, Bld 266 (*)    Calcium 8.7 (*)    Total Bilirubin 1.4 (*)    GFR, Estimated 59 (*)    All other components within normal limits  CBC WITH DIFFERENTIAL/PLATELET - Abnormal; Notable for the following components:   Neutro Abs 7.9 (*)    All other components within normal limits  CBG MONITORING, ED - Abnormal; Notable for the following components:   Glucose-Capillary 252 (*)    All other components within normal limits  CBG MONITORING, ED    EKG: EKG Interpretation Date/Time:  Monday August 30 2023 11:28:47 EDT Ventricular Rate:  86 PR Interval:  186 QRS Duration:  72 QT Interval:  354 QTC Calculation: 423 R Axis:   142  Text Interpretation: Normal sinus rhythm Right axis deviation Artifact Confirmed by Dorenda Gandy [phone]) on 08/30/2023 11:43:25 AM  Radiology: Lenell Query Chest Port 1 View Result Date: 08/30/2023 CLINICAL DATA:  Syncope EXAM: PORTABLE CHEST 1 VIEW COMPARISON:  X-ray 07/18/2021 FINDINGS: Hyperinflation. No consolidation, pneumothorax or effusion. No edema. Normal cardiopericardial silhouette. Calcified aorta. Overlapping cardiac leads. Kyphotic x-ray obscures the left lung apex. IMPRESSION: No acute cardiopulmonary disease. Hyperinflation with chronic changes. Kyphotic x-ray obscures the  left lung apex. Electronically Signed   By: Adrianna Horde M.D.   On: 08/30/2023 12:38     Procedures   Medications Ordered in the ED  0.9 %  sodium chloride  infusion ( Intravenous New Bag/Given 08/30/23 1123)                                    Medical Decision Making Deconditioned appearing elderly male, in no distress presents after episode of syncope.  Patient is awake, alert, hemodynamically unremarkable, but with EMS report of orthostatic hypotension concerns for dehydration, infection, medication effects, less likely arrhythmia all considered. Cardiac 95 sinus normal Pulse ox 97% room air normal  Amount and/or Complexity of Data Reviewed Independent Historian: EMS External Data Reviewed: notes. Labs: ordered. Decision-making details documented in ED Course. Radiology: ordered and independent interpretation performed. Decision-making details documented in ED Course. ECG/medicine tests: ordered and independent interpretation performed. Decision-making details documented in ED Course.  Risk Prescription drug management.  Update: Patient accompanied by his wife and daughter.  Wife provides additional details.  She routinely bathes the patient and was with him today, showering when he had the episode of syncope versus near syncope, was lowered to the floor. She corroborates that the patient has baseline persistent lightheadedness. She and her daughter note no recent medication changes, patient was drinking prior boost electrolyte drinks until about 1 week ago when he stopped doing so.   3:31 PM Remaining labs not available, no leukocytosis, no substantial electrolyte abnormalities though he does have mild hyponatremia, and hyperglycemia, with slight decrease in renal function, all consistent with some suspicion for mild dehydration possibly exacerbating the patient's baseline lightheadedness. Tests otherwise reassuring, he has been monitored for hours without arrhythmia, ischemic  changes, has no new complaints. We discussed the patient's history of Parkinson's, weakness, medications, and today's results, hyperglycemia, mild hyponatremia/dehydration.  Hospitalization discussed, considered, patient will return home with his family, however has a preference for this which is reasonable given his generally reassuring findings, and upcoming family wedding. We discussed the importance of monitoring, fluid resuscitation, patient discharged in stable condition.     Final diagnoses:  Syncope and collapse    ED Discharge Orders     None          Dorenda Gandy, MD 08/30/23 1531

## 2023-08-30 NOTE — ED Triage Notes (Signed)
 [facility] reports pt coming from [facility]. Wife states she was bathing husband and he had a syncopal episode. Pt does not remember what happened. Wife states pt slid to the floor, no fall.  Pt has had poor oral intake the past few days. Pt was orthostatic on EMS assessment.

## 2023-08-30 NOTE — Discharge Instructions (Addendum)
 Please be sure to stay well-hydrated, and monitor your condition carefully.  Discussed today's evaluation with your physician via telephone, to ensure appropriate ongoing care.  Return here for concerning changes in your condition.

## 2023-09-02 ENCOUNTER — Other Ambulatory Visit: Payer: Self-pay

## 2023-09-02 ENCOUNTER — Telehealth: Payer: Self-pay

## 2023-09-02 DIAGNOSIS — E1165 Type 2 diabetes mellitus with hyperglycemia: Secondary | ICD-10-CM

## 2023-09-02 LAB — MICROALBUMIN / CREATININE URINE RATIO
Creatinine, Urine: 75 mg/dL (ref 20–320)
Microalb Creat Ratio: 32 mg/g{creat} — ABNORMAL HIGH (ref <30)
Microalb, Ur: 2.4 mg/dL

## 2023-09-02 NOTE — Telephone Encounter (Signed)
 Bob Long with [facility] called and we informed her that patient needs a microalbumin and order was placed.

## 2023-09-02 NOTE — Telephone Encounter (Signed)
 Incoming call received from [facility] indicating patient came to the [facility] lab today and did not have an order, patient told the tech he was to collect a urine specimen and they assumed it was for a u/a and culture.   Tried to call patient to see if he was having any symptoms and did not get an answer  After reviewing chart patient had a future order for a MALB and we released that order. I tried to call [facility] tech back, no answer, and voicemail ful. We will try to call back later

## 2023-09-09 ENCOUNTER — Encounter: Payer: Self-pay | Admitting: Nurse Practitioner

## 2023-09-09 ENCOUNTER — Ambulatory Visit: Admitting: Nurse Practitioner

## 2023-09-09 VITALS — BP 128/78 | HR 76 | Temp 98.2°F | Resp 18

## 2023-09-09 DIAGNOSIS — G20A1 Parkinson's disease without dyskinesia, without mention of fluctuations: Secondary | ICD-10-CM

## 2023-09-09 DIAGNOSIS — E1165 Type 2 diabetes mellitus with hyperglycemia: Secondary | ICD-10-CM | POA: Diagnosis not present

## 2023-09-09 DIAGNOSIS — Z87898 Personal history of other specified conditions: Secondary | ICD-10-CM

## 2023-09-09 DIAGNOSIS — Z0000 Encounter for general adult medical examination without abnormal findings: Secondary | ICD-10-CM | POA: Diagnosis not present

## 2023-09-09 DIAGNOSIS — E871 Hypo-osmolality and hyponatremia: Secondary | ICD-10-CM

## 2023-09-09 DIAGNOSIS — I10 Essential (primary) hypertension: Secondary | ICD-10-CM | POA: Diagnosis not present

## 2023-09-09 NOTE — Progress Notes (Signed)
 Location:   [facility]   Place of Service:    Provider: Larwance Hark NP  Bob Torrez X, NP  Patient Care Team: Bob Neer X, NP as PCP - General (Internal Medicine) Bob Molly, MD as Consulting Physician (Ophthalmology)  Extended Emergency Contact Information Primary Emergency Contact: Bob Long  [place] Home Phone: [phone] Mobile Phone: [phone] Relation: Spouse Secondary Emergency Contact: Bob Long Mobile Phone: [phone] Relation: Son Preferred language: English Interpreter needed? No  Code Status: DNR Goals of care: Advanced Directive information    09/09/2023    1:29 PM  Advanced Directives  Does Patient Have a Medical Advance Directive? Yes  Type of Estate agent of [place];Living will  Does patient want to make changes to medical advance directive? No - Patient declined  Copy of Healthcare Power of Attorney in Chart? No - copy requested     Chief Complaint  Patient presents with   Hospitalization Follow-up    HPI:  Pt is a 83 y.o. male seen today for an acute visit for f/u ED eval4 ED eval for syncope 08/30/23, EKD CXR labs unremarkable except Na 130 at his baseline. Positive orthostatic.    T2DM, diet controlled, Hgb A1c 6.7 07/27/23             HTN, on Valsartan, no significant orthostatic blood pressure, HR changes.              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 CT 07/18/21 no acute intracranial process, s/p R mastoidectomy.              Parkinson's disease, followed by Neurology, taking Sinemet.              Anxiety, TSH 4.11, Vit B 872, Vit D 37 07/27/23             HLD, on Atorvastatin, LDL 77 07/27/23             Constipation, every 3-4 days BM, hard, prn MiraLax.             Hyponatremia, Na 132 07/27/23>>130 08/30/23   Past Medical History:  Diagnosis Date   Diabetes mellitus without complication (HCC)    High  cholesterol    Hypertension    Parkinson disease (HCC)    Past Surgical History:  Procedure Laterality Date   CATARACT EXTRACTION     MASTOIDECTOMY     VASECTOMY      No Known Allergies  Allergies as of 09/09/2023   No Known Allergies      Medication List        Accurate as of September 09, 2023  3:56 PM. If you have any questions, ask your nurse or doctor.          albuterol 108 (90 Base) MCG/ACT inhaler Commonly known as: VENTOLIN HFA Inhale 2 puffs into the lungs every 4 (four) hours as needed.   amLODipine 10 MG tablet Commonly known as: NORVASC Take 10 mg by mouth.   aspirin EC 81 MG tablet Take 81 mg by mouth.   atorvastatin 20 MG tablet Commonly known as: LIPITOR Take 20 mg by mouth daily.   carbidopa-levodopa 25-100 MG tablet Commonly known as: SINEMET IR Take 1 tablet by mouth 4 (four) times daily.   CENTRUM MINIS MEN 50+ PO Take by mouth daily.   polyethylene glycol 17 g packet Commonly known as:  MIRALAX / GLYCOLAX Take 17 g by mouth daily.        Review of Systems  Constitutional:  Negative for appetite change and fatigue.  HENT:  Negative for congestion and trouble swallowing.   Eyes:  Negative for visual disturbance.  Respiratory:  Negative for cough, shortness of breath and wheezing.   Cardiovascular:  Negative for chest pain, palpitations and leg swelling.  Gastrointestinal:  Positive for constipation. Negative for abdominal pain, nausea and vomiting.  Genitourinary:  Negative for frequency.  Musculoskeletal:  Positive for arthralgias and gait problem.    Immunization History  Administered Date(s) Administered   Influenza, High Dose Seasonal PF 11/16/2014, 11/27/2015, 12/16/2017, 12/05/2021, 12/08/2022   Moderna Covid-19 Fall Seasonal Vaccine 83yrs & older 12/08/2022   Moderna Covid-19 Vaccine Bivalent Booster 82yrs & up 12/05/2021   PFIZER Comirnaty(Gray Top)Covid-19 Tri-Sucrose Vaccine 12/29/2019, 08/16/2020   PFIZER(Purple  Top)SARS-COV-2 Vaccination 04/17/2019, 05/08/2019   Pneumococcal Conjugate-13 11/13/2013   Pneumococcal Polysaccharide-23 07/07/2004, 07/11/2009   RSV,unspecified 04/09/2023   Td (Adult),5 Lf Tetanus Toxid, Preservative Free 08/07/1997   Tdap 11/26/2010   Zoster Recombinant(Shingrix) 12/04/2016, 05/13/2017   Zoster, Live 08/05/2011   Pertinent  Health Maintenance Due  Topic Date Due   INFLUENZA VACCINE  10/15/2023   HEMOGLOBIN A1C  01/27/2024   OPHTHALMOLOGY EXAM  07/25/2024   FOOT EXAM  08/11/2024      07/18/2021   10:57 AM 07/15/2023    2:57 PM  Fall Risk  Falls in the past year?  0  Was there an injury with Fall?  0  Fall Risk Category Calculator  0  (RETIRED) Patient Fall Risk Level Moderate fall risk    Patient at Risk for Falls Due to  No Fall Risks  Fall risk Follow up  Falls evaluation completed     Data saved with a previous flowsheet row definition   Functional Status Survey:    Vitals:   09/09/23 1332  BP: 128/78  Pulse: 76  Resp: 18  Temp: 98.2 F (36.8 C)  SpO2: 95%   There is no height or weight on file to calculate BMI. Physical Exam Vitals and nursing note reviewed.  Constitutional:      Appearance: Normal appearance.  HENT:     Head: Normocephalic and atraumatic.     Nose: Nose normal.     Mouth/Throat:     Mouth: Mucous membranes are moist.   Eyes:     Extraocular Movements: Extraocular movements intact.     Conjunctiva/sclera: Conjunctivae normal.     Pupils: Pupils are equal, round, and reactive to light.    Cardiovascular:     Rate and Rhythm: Normal rate and regular rhythm.     Heart sounds: Murmur heard.     Comments: SM left sternal boarder.  Pulmonary:     Effort: Pulmonary effort is normal.     Breath sounds: No wheezing, rhonchi or rales.  Abdominal:     General: Bowel sounds are normal.     Palpations: Abdomen is soft.     Tenderness: There is no abdominal tenderness.   Musculoskeletal:        General: No tenderness.  Normal range of motion.     Cervical back: Normal range of motion and neck supple.     Right lower leg: No edema.     Left lower leg: No edema.   Skin:    General: Skin is warm and dry.     Findings: No rash.   Neurological:  General: No focal deficit present.     Mental Status: He is alert and oriented to person, place, and time. Mental status is at baseline.     Motor: Weakness present.     Coordination: Coordination normal.     Gait: Gait abnormal.     Comments: Mild right side facial weakness.   Psychiatric:        Mood and Affect: Mood normal.        Behavior: Behavior normal.        Thought Content: Thought content normal.        Judgment: Judgment normal.     Labs reviewed: Recent Labs    07/27/23 0809 08/30/23 1025  NA 132* 130*  K 4.8 4.0  CL 95* 95*  CO2 25 22  GLUCOSE 161* 266*  BUN 15 15  CREATININE 1.00 1.23  CALCIUM 9.4 8.7*   Recent Labs    07/27/23 0809 08/30/23 1025  AST 22 26  ALT 36 11  ALKPHOS  --  64  BILITOT 1.2 1.4*  PROT 6.9 6.6  ALBUMIN  --  3.6   Recent Labs    07/27/23 0809 08/30/23 1025  WBC 9.8 9.6  NEUTROABS 7,056 7.9*  HGB 16.3 15.5  HCT 47.0 46.0  MCV 95.3 97.3  PLT 119* 179   Lab Results  Component Value Date   TSH 4.11 07/27/2023   Lab Results  Component Value Date   HGBA1C 6.7 (H) 07/27/2023   Lab Results  Component Value Date   CHOL 153 07/27/2023   HDL 58 07/27/2023   LDLCALC 77 07/27/2023   TRIG 98 07/27/2023   CHOLHDL 2.6 07/27/2023    Significant Diagnostic Results in last 30 days:  DG Chest Port 1 View Result Date: 08/30/2023 CLINICAL DATA:  Syncope EXAM: PORTABLE CHEST 1 VIEW COMPARISON:  Long-ray 07/18/2021 FINDINGS: Hyperinflation. No consolidation, pneumothorax or effusion. No edema. Normal cardiopericardial silhouette. Calcified aorta. Overlapping cardiac leads. Kyphotic Long-ray obscures the left lung apex. IMPRESSION: No acute cardiopulmonary disease. Hyperinflation with chronic changes.  Kyphotic Long-ray obscures the left lung apex. Electronically Signed   By: Ranell Bring M.D.   On: 08/30/2023 12:38    Assessment/Plan: History of syncope ED eval for syncope 08/30/23, EKD CXR labs unremarkable except Na 130 at his baseline. Positive orthostatic.   Hx of Syncope, evaluated by Cardiology, unremarkable workups. CT 07/18/21 no acute intracranial process, s/p R mastoidectomy.  Needs neurology in [place], referral placed CT head to evaluate further.   Type 2 diabetes mellitus (HCC) T2DM, diet controlled, Hgb A1c 6.7 07/27/23  HTN (hypertension) on Valsartan, no significant orthostatic blood pressure, HR changes.    Parkinson's disease (HCC) followed by Neurology, taking Sinemet.   Hyponatremia Na 132 07/27/23>>130 08/30/23 Repeat CMP/eGFR 09/13/23    Family/ staff Communication: plan of care reviewed with the patient, wife, and son  Labs/tests ordered:  CT head, neurology referral, CMP/eGFR

## 2023-09-09 NOTE — Progress Notes (Deleted)
 Subjective:   Bob Long is a 83 y.o. male who presents for Medicare Annual/Subsequent preventive examination.  Visit Complete: {VISITMETHODVS:[phone]}  Patient Medicare AWV questionnaire was completed by the patient on ***; I have confirmed that all information answered by patient is correct and no changes since this date.        Objective:    There were no vitals filed for this visit. There is no height or weight on file to calculate BMI.     07/15/2023    2:58 PM 07/18/2021   10:56 AM  Advanced Directives  Does Patient Have a Medical Advance Directive? Yes Yes  Type of Estate agent of [place];Living will Healthcare Power of [place];Living will  Does patient want to make changes to medical advance directive? No - Patient declined No - Patient declined  Copy of Healthcare Power of Attorney in Chart? No - copy requested No - copy requested    Current Medications (verified) Outpatient Encounter Medications as of 09/09/2023  Medication Sig   albuterol (VENTOLIN HFA) 108 (90 Base) MCG/ACT inhaler Inhale 2 puffs into the lungs every 4 (four) hours as needed.   amLODipine (NORVASC) 10 MG tablet Take 10 mg by mouth.   aspirin EC 81 MG tablet Take 81 mg by mouth.   atorvastatin (LIPITOR) 20 MG tablet Take 20 mg by mouth daily.   carbidopa-levodopa (SINEMET IR) 25-100 MG tablet Take 1 tablet by mouth 4 (four) times daily.   Multiple Vitamins-Minerals (CENTRUM MINIS MEN 50+ PO) Take by mouth daily.   polyethylene glycol (MIRALAX / GLYCOLAX) 17 g packet Take 17 g by mouth daily.   No facility-administered encounter medications on file as of 09/09/2023.    Allergies (verified) Patient has no known allergies.   History: Past Medical History:  Diagnosis Date   Diabetes mellitus without complication (HCC)    High cholesterol    Hypertension    Parkinson disease (HCC)    Past Surgical History:  Procedure Laterality Date   CATARACT EXTRACTION      MASTOIDECTOMY     VASECTOMY     No family history on file. Social History   Socioeconomic History   Marital status: Married    Spouse name: Not on file   Number of children: 3   Years of education: Not on file   Highest education level: Bachelor's degree (e.g., BA, AB, BS)  Occupational History   Not on file  Tobacco Use   Smoking status: Former    Types: Cigarettes   Smokeless tobacco: Never   Tobacco comments:    Quit in 2005  Vaping Use   Vaping status: Never Used  Substance and Sexual Activity   Alcohol use: Yes    Alcohol/week: 7.0 standard drinks of alcohol    Types: 7 Glasses of wine per week    Comment: white wine ever evening   Drug use: Never   Sexual activity: Not on file  Other Topics Concern   Not on file  Social History Narrative   Not on file   Social Drivers of Health   Financial Resource Strain: Low Risk  (07/11/2021)   Received from [facility], [facility] visits prior to 05/16/2022.   Overall Financial Resource Strain (CARDIA)    Difficulty of Paying Living Expenses: Not hard at all  Food Insecurity: Low Risk  (06/12/2022)   [facility] from [facility]   Hunger Vital Sign    Within the past 12 months, you worried that  your food would run out before you got money to buy more: Never true    Within the past 12 months, the food you bought just didn't last and you didn't have money to get more. : Never true  Transportation Needs: Not on file (06/12/2022)  Physical Activity: Unknown (07/11/2021)   Received from [facility], [facility] visits prior to 05/16/2022.   Exercise Vital Sign    On average, how many days per week do you engage in moderate to strenuous exercise (like a brisk walk)?: 0 days    Minutes of Exercise per Session: Not on file  Stress: No Stress Concern Present (07/11/2021)   Received from [facility], [facility] visits prior to 05/16/2022.   [employer] of  Occupational Health - Occupational Stress Questionnaire    Feeling of Stress : Only a little  Social Connections: Socially Isolated (07/11/2021)   Received from [facility], [facility] visits prior to 05/16/2022.   Social Connection and Isolation Panel    In a typical week, how many times do you talk on the phone with family, friends, or neighbors?: Once a week    How often do you get together with friends or relatives?: Never    How often do you attend church or religious services?: Never    Do you belong to any clubs or organizations such as church groups, unions, fraternal or athletic groups, or school groups?: No    How often do you attend meetings of the clubs or organizations you belong to?: Never    Are you married, widowed, divorced, separated, never married, or living with a partner?: Married    Tobacco Counseling Counseling given: Not Answered Tobacco comments: Quit in 2005   Clinical Intake:                        Activities of Daily Living     No data to display          Patient Care Team: Latrel Szymczak X, NP as PCP - General (Internal Medicine) Charmayne Molly, MD as Consulting Physician (Ophthalmology)  Indicate any recent Medical Services you may have received from other than [facility] providers in the past year (date may be approximate).     Assessment:   This is a routine wellness examination for [facility].  Hearing/Vision screen No results found.   Goals Addressed   None    Depression Screen    07/15/2023    2:57 PM  PHQ 2/9 Scores  PHQ - 2 Score 0    Fall Risk    07/15/2023    2:57 PM  Fall Risk   Falls in the past year? 0  Number falls in past yr: 0  Injury with Fall? 0  Risk for fall due to : No Fall Risks  Follow up Falls evaluation completed    MEDICARE RISK AT HOME:    TIMED UP AND GO:  Was the test performed?  {[phone]}    Cognitive Function:        Immunizations Immunization  History  Administered Date(s) Administered   Influenza, High Dose Seasonal PF 11/16/2014, 11/27/2015, 12/16/2017, 12/05/2021, 12/08/2022   Moderna Covid-19 Fall Seasonal Vaccine 58yrs & older 12/08/2022   Moderna Covid-19 Vaccine Bivalent Booster 67yrs & up 12/05/2021   PFIZER Comirnaty(Gray Top)Covid-19 Tri-Sucrose Vaccine 12/29/2019, 08/16/2020   PFIZER(Purple Top)SARS-COV-2 Vaccination 04/17/2019, 05/08/2019   Pneumococcal Conjugate-13 11/13/2013   Pneumococcal Polysaccharide-23 07/07/2004,  07/11/2009   RSV,unspecified 04/09/2023   Td (Adult),5 Lf Tetanus Toxid, Preservative Free 08/07/1997   Tdap 11/26/2010   Zoster Recombinant(Shingrix) 12/04/2016, 05/13/2017   Zoster, Live 08/05/2011    {TDAP status:2101805}  {Flu Vaccine status:2101806}  {Pneumococcal vaccine status:2101807}  {Covid-19 vaccine status:2101808}  Qualifies for Shingles Vaccine? {YES/NO:21197}  Zostavax completed {YES/NO:21197}  {Shingrix Completed?:2101804}  Screening Tests Health Maintenance  Topic Date Due   DTaP/Tdap/Td (2 - Td or Tdap) 11/25/2020   Medicare Annual Wellness (AWV)  06/26/2023   COVID-19 Vaccine (7 - Pfizer risk 2024-25 season) 12/14/2023 (Originally 06/07/2023)   INFLUENZA VACCINE  10/15/2023   HEMOGLOBIN A1C  01/27/2024   OPHTHALMOLOGY EXAM  07/25/2024   FOOT EXAM  08/11/2024   Diabetic kidney evaluation - eGFR measurement  08/29/2024   Diabetic kidney evaluation - Urine ACR  09/01/2024   Pneumococcal Vaccine: 50+ Years  Completed   Zoster Vaccines- Shingrix  Completed   Hepatitis B Vaccines  Aged Out   HPV VACCINES  Aged Out   Meningococcal B Vaccine  Aged Out    Health Maintenance  Health Maintenance Due  Topic Date Due   DTaP/Tdap/Td (2 - Td or Tdap) 11/25/2020   Medicare Annual Wellness (AWV)  06/26/2023    {Colorectal cancer screening:2101809}  Lung Cancer Screening: (Low Dose CT Chest recommended if Age 21-80 years, 20 pack-year currently smoking OR have quit w/in  15years.) {DOES NOT does:27190::does not} qualify.   Lung Cancer Screening Referral: ***  Additional Screening:  Hepatitis C Screening: {DOES NOT does:27190::does not} qualify; Completed ***  Vision Screening: Recommended annual ophthalmology exams for early detection of glaucoma and other disorders of the eye. Is the patient up to date with their annual eye exam?  {YES/NO:21197} Who is the provider or what is the name of the office in which the patient attends annual eye exams? *** If pt is not established with a provider, would they like to be referred to a provider to establish care? {YES/NO:21197}.   Dental Screening: Recommended annual dental exams for proper oral hygiene  Diabetic Foot Exam: {Diabetic Foot Exam:2101802}  Community Resource Referral / Chronic Care Management: CRR required this visit?  {YES/NO:21197}  CCM required this visit?  {CCM Required [phone]}     Plan:     I have personally reviewed and noted the following in the patient's chart:   Medical and social history Use of alcohol, tobacco or illicit drugs  Current medications and supplements including opioid prescriptions. {Opioid Prescriptions:[phone]} Functional ability and status Nutritional status Physical activity Advanced directives List of other physicians Hospitalizations, surgeries, and ER visits in previous 12 months Vitals Screenings to include cognitive, depression, and falls Referrals and appointments  In addition, I have reviewed and discussed with patient certain preventive protocols, quality metrics, and best practice recommendations. A written personalized care plan for preventive services as well as general preventive health recommendations were provided to patient.     Keontay Vora X Anh Bigos, NP   09/09/2023   After Visit Summary: {CHL AMB AWV After Visit Summary:[phone]}  Nurse Notes: ***

## 2023-09-09 NOTE — Assessment & Plan Note (Addendum)
 ED eval for syncope 08/30/23, EKD CXR labs unremarkable except Na 130 at his baseline. Positive orthostatic.   Hx of Syncope, evaluated by Cardiology, unremarkable workups. CT 07/18/21 no acute intracranial process, s/p R mastoidectomy.  Needs neurology in [place], referral placed CT head to evaluate further.

## 2023-09-09 NOTE — Assessment & Plan Note (Signed)
 on Valsartan, no significant orthostatic blood pressure, HR changes.

## 2023-09-09 NOTE — Patient Instructions (Signed)
 Follow up with [facility] [phone]

## 2023-09-09 NOTE — Assessment & Plan Note (Signed)
 T2DM, diet controlled, Hgb A1c 6.7 07/27/23

## 2023-09-09 NOTE — Assessment & Plan Note (Addendum)
 Na 132 07/27/23>>130 08/30/23 Repeat CMP/eGFR 09/13/23

## 2023-09-09 NOTE — Assessment & Plan Note (Signed)
 followed by Neurology, taking Sinemet.

## 2023-09-10 ENCOUNTER — Encounter: Payer: Self-pay | Admitting: Neurology

## 2023-09-13 ENCOUNTER — Emergency Department (HOSPITAL_COMMUNITY)

## 2023-09-13 ENCOUNTER — Other Ambulatory Visit: Payer: Self-pay

## 2023-09-13 ENCOUNTER — Inpatient Hospital Stay (HOSPITAL_COMMUNITY)
Admission: EM | Admit: 2023-09-13 | Discharge: 2023-09-17 | DRG: 644 | Disposition: A | Discharge status: Skilled Nursing Facility | Source: Skilled Nursing Facility | Attending: Family Medicine | Admitting: Family Medicine

## 2023-09-13 ENCOUNTER — Encounter (HOSPITAL_COMMUNITY): Payer: Self-pay

## 2023-09-13 DIAGNOSIS — E878 Other disorders of electrolyte and fluid balance, not elsewhere classified: Secondary | ICD-10-CM | POA: Diagnosis present

## 2023-09-13 DIAGNOSIS — E119 Type 2 diabetes mellitus without complications: Secondary | ICD-10-CM | POA: Diagnosis present

## 2023-09-13 DIAGNOSIS — E876 Hypokalemia: Secondary | ICD-10-CM | POA: Diagnosis present

## 2023-09-13 DIAGNOSIS — I959 Hypotension, unspecified: Secondary | ICD-10-CM | POA: Diagnosis present

## 2023-09-13 DIAGNOSIS — Z79899 Other long term (current) drug therapy: Secondary | ICD-10-CM

## 2023-09-13 DIAGNOSIS — E860 Dehydration: Secondary | ICD-10-CM | POA: Diagnosis present

## 2023-09-13 DIAGNOSIS — Z87891 Personal history of nicotine dependence: Secondary | ICD-10-CM

## 2023-09-13 DIAGNOSIS — K5901 Slow transit constipation: Secondary | ICD-10-CM | POA: Diagnosis present

## 2023-09-13 DIAGNOSIS — Z7982 Long term (current) use of aspirin: Secondary | ICD-10-CM

## 2023-09-13 DIAGNOSIS — G4700 Insomnia, unspecified: Secondary | ICD-10-CM | POA: Diagnosis present

## 2023-09-13 DIAGNOSIS — E872 Acidosis, unspecified: Secondary | ICD-10-CM | POA: Diagnosis present

## 2023-09-13 DIAGNOSIS — E7800 Pure hypercholesterolemia, unspecified: Secondary | ICD-10-CM | POA: Diagnosis present

## 2023-09-13 DIAGNOSIS — R531 Weakness: Secondary | ICD-10-CM

## 2023-09-13 DIAGNOSIS — E871 Hypo-osmolality and hyponatremia: Principal | ICD-10-CM | POA: Diagnosis present

## 2023-09-13 DIAGNOSIS — G20A1 Parkinson's disease without dyskinesia, without mention of fluctuations: Secondary | ICD-10-CM | POA: Diagnosis present

## 2023-09-13 DIAGNOSIS — E222 Syndrome of inappropriate secretion of antidiuretic hormone: Secondary | ICD-10-CM | POA: Diagnosis not present

## 2023-09-13 DIAGNOSIS — I10 Essential (primary) hypertension: Secondary | ICD-10-CM | POA: Diagnosis present

## 2023-09-13 DIAGNOSIS — E785 Hyperlipidemia, unspecified: Secondary | ICD-10-CM | POA: Diagnosis present

## 2023-09-13 DIAGNOSIS — Z87898 Personal history of other specified conditions: Secondary | ICD-10-CM

## 2023-09-13 LAB — BASIC METABOLIC PANEL WITH GFR
Anion gap: 5 (ref 5–15)
BUN: 11 mg/dL (ref 8–23)
CO2: 19 mmol/L — ABNORMAL LOW (ref 22–32)
Calcium: 6.3 mg/dL — CL (ref 8.9–10.3)
Chloride: 104 mmol/L (ref 98–111)
Creatinine, Ser: 0.85 mg/dL (ref 0.61–1.24)
GFR, Estimated: >60 mL/min (ref >60)
Glucose, Bld: 135 mg/dL — ABNORMAL HIGH (ref 70–99)
Potassium: 2.9 mmol/L — ABNORMAL LOW (ref 3.5–5.1)
Sodium: 128 mmol/L — ABNORMAL LOW (ref 135–145)

## 2023-09-13 LAB — CBC
HCT: 27.3 % — ABNORMAL LOW (ref 39.0–52.0)
Hemoglobin: 9.7 g/dL — ABNORMAL LOW (ref 13.0–17.0)
MCH: 33.9 pg (ref 26.0–34.0)
MCHC: 35.5 g/dL (ref 30.0–36.0)
MCV: 95.5 fL (ref 80.0–100.0)
Platelets: 136 10*3/uL — ABNORMAL LOW (ref 150–400)
RBC: 2.86 MIL/uL — ABNORMAL LOW (ref 4.22–5.81)
RDW: 12.1 % (ref 11.5–15.5)
WBC: 12.1 10*3/uL — ABNORMAL HIGH (ref 4.0–10.5)
nRBC: 0 % (ref 0.0–0.2)

## 2023-09-13 LAB — LACTIC ACID, PLASMA
Lactic Acid, Venous: 2.5 mmol/L (ref 0.5–1.9)
Lactic Acid, Venous: 1.5 mmol/L (ref 0.5–1.9)

## 2023-09-13 LAB — TROPONIN I (HIGH SENSITIVITY)
Troponin I (High Sensitivity): 8 ng/L (ref <18)
Troponin I (High Sensitivity): 13 ng/L (ref <18)

## 2023-09-13 LAB — ALBUMIN: Albumin: 2.6 g/dL — ABNORMAL LOW (ref 3.5–5.0)

## 2023-09-13 LAB — MAGNESIUM: Magnesium: 1.5 mg/dL — ABNORMAL LOW (ref 1.7–2.4)

## 2023-09-13 MED ORDER — ONDANSETRON HCL 4 MG/2ML IJ SOLN: 4.0000 mg | Freq: Four times a day (QID) | INTRAMUSCULAR | Status: DC | PRN

## 2023-09-13 MED ORDER — ENOXAPARIN SODIUM 40 MG/0.4ML IJ SOSY
40.0000 mg | PREFILLED_SYRINGE | INTRAMUSCULAR | Status: DC
  Administered 2023-09-14 – 2023-09-17 (×4): 40 mg via SUBCUTANEOUS
  Filled 2023-09-13 (×4): qty 0.4

## 2023-09-13 MED ORDER — POTASSIUM CHLORIDE CRYS ER 20 MEQ PO TBCR
40.0000 meq | EXTENDED_RELEASE_TABLET | Freq: Once | ORAL | Status: AC
  Administered 2023-09-13: 40 meq via ORAL
  Filled 2023-09-13: qty 2

## 2023-09-13 MED ORDER — CARBIDOPA-LEVODOPA 25-100 MG PO TABS: 1.0000 | ORAL_TABLET | ORAL | Status: DC

## 2023-09-13 MED ORDER — ATORVASTATIN CALCIUM 10 MG PO TABS
20.0000 mg | ORAL_TABLET | Freq: Every day | ORAL | Status: DC
  Administered 2023-09-14 – 2023-09-17 (×4): 20 mg via ORAL
  Filled 2023-09-13 (×4): qty 2

## 2023-09-13 MED ORDER — ONDANSETRON HCL 4 MG PO TABS
4.0000 mg | ORAL_TABLET | Freq: Four times a day (QID) | ORAL | Status: DC | PRN
Start: 2023-09-13 — End: 2023-09-17

## 2023-09-13 MED ORDER — ASPIRIN 81 MG PO TBEC
81.0000 mg | DELAYED_RELEASE_TABLET | Freq: Every day | ORAL | Status: DC
  Administered 2023-09-14 – 2023-09-17 (×4): 81 mg via ORAL
  Filled 2023-09-13 (×4): qty 1

## 2023-09-13 MED ORDER — LACTATED RINGERS IV SOLN: INTRAVENOUS | Status: DC

## 2023-09-13 MED ORDER — CALCIUM GLUCONATE-NACL 1-0.675 GM/50ML-% IV SOLN
1.0000 g | Freq: Once | INTRAVENOUS | Status: AC
  Administered 2023-09-13: 1000 mg via INTRAVENOUS
  Filled 2023-09-13: qty 50

## 2023-09-13 MED ORDER — ACETAMINOPHEN 325 MG PO TABS: 650.0000 mg | ORAL_TABLET | Freq: Four times a day (QID) | ORAL | Status: DC | PRN

## 2023-09-13 MED ORDER — CARBIDOPA-LEVODOPA 25-100 MG PO TABS
1.0000 | ORAL_TABLET | Freq: Two times a day (BID) | ORAL | Status: DC
  Administered 2023-09-13 – 2023-09-17 (×8): 1 via ORAL
  Filled 2023-09-13 (×8): qty 1

## 2023-09-13 MED ORDER — LACTATED RINGERS IV BOLUS
500.0000 mL | Freq: Once | INTRAVENOUS | Status: AC
  Administered 2023-09-13: 500 mL via INTRAVENOUS

## 2023-09-13 MED ORDER — ACETAMINOPHEN 650 MG RE SUPP
650.0000 mg | Freq: Four times a day (QID) | RECTAL | Status: DC | PRN
Start: 2023-09-13 — End: 2023-09-17

## 2023-09-13 MED ORDER — CARBIDOPA-LEVODOPA 25-100 MG PO TABS
2.0000 | ORAL_TABLET | Freq: Every day | ORAL | Status: DC
  Administered 2023-09-14 – 2023-09-17 (×4): 2 via ORAL
  Filled 2023-09-13 (×4): qty 2

## 2023-09-13 MED ORDER — TRAZODONE HCL 50 MG PO TABS
50.0000 mg | ORAL_TABLET | Freq: Every evening | ORAL | Status: DC | PRN
  Administered 2023-09-13 – 2023-09-16 (×4): 50 mg via ORAL
  Filled 2023-09-13 (×4): qty 1

## 2023-09-13 MED ORDER — IRBESARTAN 150 MG PO TABS
150.0000 mg | ORAL_TABLET | Freq: Every day | ORAL | Status: DC
  Administered 2023-09-14 – 2023-09-17 (×4): 150 mg via ORAL
  Filled 2023-09-13 (×4): qty 1

## 2023-09-13 MED ORDER — CALCIUM GLUCONATE-NACL 2-0.675 GM/100ML-% IV SOLN
2.0000 g | Freq: Once | INTRAVENOUS | Status: AC
  Administered 2023-09-13: 2000 mg via INTRAVENOUS
  Filled 2023-09-13: qty 100

## 2023-09-13 NOTE — H&P (Signed)
 History and Physical    Bob Long FMW:[id number] DOB: [birth year] DOA: 09/13/2023  PCP: Mast, Man X, NP   Chief Complaint: Fall, hypotension  HPI: Bob Long is a 83 y.o. male with medical history significant of Parkinson's, hypertension, hyperlipidemia who presented to emergency department after a fall.  Patient got up and felt lightheaded.  He has been having an ongoing issues with weakness and lightheadedness.  EMS was called he was given some IV fluids.  He was found to be hypotensive and transported to the ER.  On arrival he was afebrile and hemodynamically stable. .  Labs were obtained on presentation which showed sodium 128, potassium 2.99, calcium 6.3, WBC 12.1, hemoglobin 9.7, platelets 136, troponin 8, lactic acid 2.5, 1.5, magnesium 1.5.  Patient had chest x-ray which showed no acute findings.  CT head showed no acute findings.  Patient was admitted for further workup he was given IV calcium gluconate and potassium.  On admission, patient endorsed poor p.o. intake and dehydration.  He denied any blood in his stool or overt GI bleeding.   Review of Systems: Review of Systems  Constitutional:  Positive for malaise/fatigue. Negative for chills and fever.  HENT: Negative.    Eyes: Negative.   Respiratory: Negative.    Cardiovascular: Negative.   Gastrointestinal: Negative.   Genitourinary: Negative.   Musculoskeletal: Negative.   Skin: Negative.   Neurological: Negative.   Endo/Heme/Allergies: Negative.   Psychiatric/Behavioral: Negative.       As per HPI otherwise 10 point review of systems negative.   No Known Allergies  Past Medical History:  Diagnosis Date   Diabetes mellitus without complication (HCC)    High cholesterol    Hypertension    Parkinson disease (HCC)     Past Surgical History:  Procedure Laterality Date   CATARACT EXTRACTION     MASTOIDECTOMY     VASECTOMY       reports that he has quit smoking. His smoking use included cigarettes. He  has never used smokeless tobacco. He reports current alcohol use of about 7.0 standard drinks of alcohol per week. He reports that he does not use drugs.  History reviewed. No pertinent family history.  Prior to Admission medications   Medication Sig Start Date End Date Taking? Authorizing Provider  aspirin EC 81 MG tablet Take 81 mg by mouth daily. 04/18/15  Yes [provider]  atorvastatin (LIPITOR) 20 MG tablet Take 20 mg by mouth daily.   Yes [provider]  carbidopa-levodopa (SINEMET IR) 25-100 MG tablet Take 1-2 tablets by mouth See admin instructions. Take 1 tablet by mouth in the morning & at bedtime and 2 tablets at noontime   Yes [provider]  Multiple Vitamins-Minerals (CENTRUM MINIS MEN 50+ PO) Take 1 tablet by mouth daily after supper.   Yes [provider]  polyethylene glycol (MIRALAX / GLYCOLAX) 17 g packet Take 17 g by mouth daily as needed for mild constipation.   Yes [provider]  valsartan (DIOVAN) 160 MG tablet Take 160 mg by mouth daily.   Yes [provider]    Physical Exam: Vitals:   09/13/23 1800 09/13/23 2030 09/13/23 2036 09/13/23 2104  BP: 128/70 (!) 211/97 (!) 156/99 (!) 176/88  Pulse: 77 87 93 82  Resp: 16 (!) 33 (!) 24 (!) 21  Temp: 97.9 F (36.6 C)  97.9 F (36.6 C) 98.1 F (36.7 C)  TempSrc: Oral  Oral Oral  SpO2: 99% 97%  97%   Physical  Exam Constitutional:      Appearance: He is normal weight.  HENT:     Head: Normocephalic.     Nose: Nose normal.     Mouth/Throat:     Mouth: Mucous membranes are moist.   Eyes:     Pupils: Pupils are equal, round, and reactive to light.    Cardiovascular:     Rate and Rhythm: Normal rate and regular rhythm.     Pulses: Normal pulses.  Pulmonary:     Effort: Pulmonary effort is normal.  Abdominal:     General: Abdomen is flat.   Musculoskeletal:     Cervical back: Normal range of motion.   Skin:    General: Skin is warm.    Neurological:     General: No focal deficit present.     Mental Status: He is alert.   Psychiatric:        Mood and Affect: Mood normal.       Labs on Admission: I have personally reviewed the patients's labs and imaging studies.  Assessment/Plan Principal Problem:   Electrolyte abnormality   # Hypotension and fall most likely secondary to dehydration - Patient endorsing poor p.o. intake and found to have numerous electrolyte abnormalities - Patient denies acute diarrhea  Plan: Continue IV hydration  # Hypocalcemia-replete with IV calcium gluconate  # Hypokalemia-replete with potassium  # Hyponatremia-likely related to dehydration.  Will continue IV fluids  # Parkinson's-continue home patient  # Hypertension-continue irbesartan  # Insomnia-as needed trazodone  # Hyperlipidemia-continue Lipitor   Admission status: Observation Telemetry  Certification: The appropriate patient status for this patient is OBSERVATION. Observation status is judged to be reasonable and necessary in order to provide the required intensity of service to ensure the patient's safety. The patient's presenting symptoms, physical exam findings, and initial radiographic and laboratory data in the context of their medical condition is felt to place them at decreased risk for further clinical deterioration. Furthermore, it is anticipated that the patient will be medically stable for discharge from the hospital within 2 midnights of admission.     Lamar Dess MD [facility] If 7PM-7AM, please contact night-coverage [email or web address]  09/13/2023, 10:44 PM

## 2023-09-13 NOTE — ED Notes (Signed)
 ED TO INPATIENT HANDOFF REPORT  Name/Age/Gender Bob Long 83 y.o. male  Code Status Advance Directive Documentation    Flowsheet Row Most Recent Value  Type of Advance Directive Living will, Healthcare Power of Attorney  Pre-existing out of facility DNR order (yellow form or pink MOST form) --  MOST Form in Place? --    Home/SNF/Other Rehab  Chief Complaint Electrolyte abnormality [E87.8]  Level of Care/Admitting Diagnosis ED Disposition     ED Disposition  Admit   Condition  --   Comment  Hospital Area: [facility] [[id number]]  Level of Care: Telemetry [5]  Admit to tele based on following criteria: Eval of Syncope  May place patient in observation at [facility] or Bob Long if equivalent level of care is available:: No  Covid Evaluation: Asymptomatic - no recent exposure (last 10 days) testing not required  Diagnosis: Electrolyte abnormality [630268]  Admitting Physician: DENA CHARLESTON [[id number]]  Attending Physician: DENA CHARLESTON [phone]          Medical History Past Medical History:  Diagnosis Date   Diabetes mellitus without complication (HCC)    High cholesterol    Hypertension    Parkinson disease (HCC)     Allergies No Known Allergies  IV Location/Drains/Wounds Patient Lines/Drains/Airways Status     Active Line/Drains/Airways     Name Placement date Placement time Site Days   Peripheral IV 09/13/23 18 G Left Antecubital 09/13/23  1518  Antecubital  less than 1            Labs/Imaging Results for orders placed or performed during the hospital encounter of 09/13/23 (from the past 48 hours)  Basic metabolic panel     Status: Abnormal   Collection Time: 09/13/23  3:38 PM  Result Value Ref Range   Sodium 128 (L) 135 - 145 mmol/L   Potassium 2.9 (L) 3.5 - 5.1 mmol/L   Chloride 104 98 - 111 mmol/L   CO2 19 (L) 22 - 32 mmol/L   Glucose, Bld 135 (H) 70 - 99 mg/dL    Comment: Glucose reference range applies  only to samples taken after fasting for at least 8 hours.   BUN 11 8 - 23 mg/dL   Creatinine, Ser 9.14 0.61 - 1.24 mg/dL   Calcium 6.3 (LL) 8.9 - 10.3 mg/dL    Comment: CRITICAL RESULT CALLED TO, READ BACK BY AND VERIFIED WITH BOWEN, M RN AT 1623 ON 09/13/2023 BY XIONG, K    GFR, Estimated >60 >60 mL/min    Comment: (NOTE) Calculated using the CKD-EPI Creatinine Equation (2021)    Anion gap 5 5 - 15    Comment: Performed at [facility], [address] [place], [place] [address]  CBC     Status: Abnormal   Collection Time: 09/13/23  3:38 PM  Result Value Ref Range   WBC 12.1 (H) 4.0 - 10.5 K/uL   RBC 2.86 (L) 4.22 - 5.81 MIL/uL   Hemoglobin 9.7 (L) 13.0 - 17.0 g/dL   HCT 72.6 (L) 60.9 - 47.9 %   MCV 95.5 80.0 - 100.0 fL   MCH 33.9 26.0 - 34.0 pg   MCHC 35.5 30.0 - 36.0 g/dL   RDW 87.8 88.4 - 84.4 %   Platelets 136 (L) 150 - 400 K/uL   nRBC 0.0 0.0 - 0.2 %    Comment: Performed at [facility], [address], [place], [place] [address]  Troponin I (High Sensitivity)  Status: None   Collection Time: 09/13/23  3:38 PM  Result Value Ref Range   Troponin I (High Sensitivity) 8 <18 ng/L    Comment: (NOTE) Elevated high sensitivity troponin I (hsTnI) values and significant  changes across serial measurements may suggest ACS but many other  chronic and acute conditions are known to elevate hsTnI results.  Refer to the Links section for chest pain algorithms and additional  guidance. Performed at [facility], [address], [place], [place] [address]   Lactic acid, plasma     Status: Abnormal   Collection Time: 09/13/23  3:38 PM  Result Value Ref Range   Lactic Acid, Venous 2.5 (HH) 0.5 - 1.9 mmol/L    Comment: CRITICAL RESULT CALLED TO, READ BACK BY AND VERIFIED WITH WAYLAN HERO RN AT 1602 ON 09/13/2023 BY PRUDY POUR Performed at [facility], [address] [place], [place] [address]   Albumin      Status: Abnormal   Collection Time: 09/13/23  3:38 PM  Result Value Ref Range   Albumin 2.6 (L) 3.5 - 5.0 g/dL    Comment: Performed at [facility], [address], [place], [place] [address]  Magnesium     Status: Abnormal   Collection Time: 09/13/23  3:38 PM  Result Value Ref Range   Magnesium 1.5 (L) 1.7 - 2.4 mg/dL    Comment: Performed at [facility], [address], [place], [place] [address]  Lactic acid, plasma     Status: None   Collection Time: 09/13/23  5:37 PM  Result Value Ref Range   Lactic Acid, Venous 1.5 0.5 - 1.9 mmol/L    Comment: Performed at [facility], [address] [place], [place] [address]  Troponin I (High Sensitivity)     Status: None   Collection Time: 09/13/23  5:37 PM  Result Value Ref Range   Troponin I (High Sensitivity) 13 <18 ng/L    Comment: (NOTE) Elevated high sensitivity troponin I (hsTnI) values and significant  changes across serial measurements may suggest ACS but many other  chronic and acute conditions are known to elevate hsTnI results.  Refer to the Links section for chest pain algorithms and additional  guidance. Performed at [facility], [address] [place], [place] [address]    CT Head Wo Contrast Result Date: 09/13/2023 CLINICAL DATA:  Lightheaded with frequent falls. EXAM: CT HEAD WITHOUT CONTRAST TECHNIQUE: Contiguous axial images were obtained from the base of the skull through the vertex without intravenous contrast. RADIATION DOSE REDUCTION: This exam was performed according to the departmental dose-optimization program which includes automated exposure control, adjustment of the mA and/or kV according to patient size and/or use of iterative reconstruction technique. COMPARISON:  Jul 18, 2021 FINDINGS: Brain: There is generalized cerebral atrophy with widening of the extra-axial spaces and ventricular dilatation. There are areas of decreased  attenuation within the white matter tracts of the supratentorial brain, consistent with microvascular disease changes. Vascular: No hyperdense vessel or unexpected calcification. Skull: Normal. Negative for fracture or focal lesion. Sinuses/Orbits: There is mild anterior left ethmoid sinus mucosal thickening. Postoperative changes consistent with prior right mastoidectomy are noted. Other: None. IMPRESSION: 1. 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 2. No acute intracranial abnormality. 3. Evidence of prior right mastoidectomy. Electronically Signed   By: Suzen Dials M.D.   On: 09/13/2023 18:35   DG Chest Port 1 View Result Date:  09/13/2023 CLINICAL DATA:  Shortness of breath EXAM: PORTABLE CHEST 1 VIEW COMPARISON:  08/30/2023 FINDINGS: No acute airspace disease or effusion. Stable cardiomediastinal silhouette with aortic atherosclerosis. No pneumothorax. Probable calcified granuloma in the left lower lung. IMPRESSION: No active disease. Electronically Signed   By: Luke Bun M.D.   On: 09/13/2023 15:53    Pending Labs Unresulted Labs (From admission, onward)    None       Vitals/Pain Today's Vitals   09/13/23 1700 09/13/23 1730 09/13/23 1800 09/13/23 1935  BP: 121/68  128/70   Pulse: 79 81 77   Resp: 16  16   Temp:   97.9 F (36.6 C)   TempSrc:   Oral   SpO2: 97% 98% 99%   PainSc:    0-No pain    Isolation Precautions No active isolations  Medications Medications  calcium gluconate 1 g/ 50 mL sodium chloride  IVPB (1,000 mg Intravenous New Bag/Given 09/13/23 1934)  lactated ringers bolus 500 mL (0 mLs Intravenous Stopped 09/13/23 1704)  potassium chloride SA (KLOR-CON M) CR tablet 40 mEq (40 mEq Oral Given 09/13/23 1734)    Mobility non-ambulatory

## 2023-09-13 NOTE — ED Notes (Signed)
 Pt unable to stand unable to complete orthostatics

## 2023-09-13 NOTE — ED Provider Notes (Signed)
 [place] EMERGENCY DEPARTMENT AT [facility] Provider Note   CSN: [id number] Arrival date & time: 09/13/23  1508     Patient presents with: Fall and Hypotension   Nyzir Dubois is a 83 y.o. male.   83 year old male with past medical history of Parkinson's disease, hypertension, hyperlipidemia, and diabetes presenting to the emergency department today after a fall at home.  The patient states that he got up and started walking became lightheaded.  He did not lose consciousness but did fall.  He states he did not hit his head.  Denies any injuries.  He states that this has been an ongoing issue for him.  He states that he is felt generally weak today but denies any focal weakness, numbness, or tingling.  He was evaluated last month for this and had unremarkable workup.  Initially his blood pressures were low with medics.  He received a 250 mL bolus and blood pressures improved to the 120s systolic from the 60s systolic.   Fall       Prior to Admission medications   Medication Sig Start Date End Date Taking? Authorizing Provider  albuterol (VENTOLIN HFA) 108 (90 Base) MCG/ACT inhaler Inhale 2 puffs into the lungs every 4 (four) hours as needed. 01/02/20   [provider]  amLODipine (NORVASC) 10 MG tablet Take 10 mg by mouth. 01/01/16   [provider]  aspirin EC 81 MG tablet Take 81 mg by mouth. 04/18/15   [provider]  atorvastatin (LIPITOR) 20 MG tablet Take 20 mg by mouth daily.    [provider]  carbidopa-levodopa (SINEMET IR) 25-100 MG tablet Take 1 tablet by mouth 4 (four) times daily.    [provider]  Multiple Vitamins-Minerals (CENTRUM MINIS MEN 50+ PO) Take by mouth daily.    [provider]  polyethylene glycol (MIRALAX / GLYCOLAX) 17 g packet Take 17 g by mouth daily.    [provider]    Allergies: Patient has no known allergies.    Review of Systems  Constitutional:  Positive for  fatigue.  Neurological:  Positive for light-headedness.  All other systems reviewed and are negative.   Updated Vital Signs BP 128/70   Pulse 77   Temp 97.9 F (36.6 C) (Oral)   Resp 16   SpO2 99%   Physical Exam Vitals and nursing note reviewed.   Gen: NAD Eyes: PERRL, EOMI HEENT: no oropharyngeal swelling Neck: trachea midline Resp: clear to auscultation bilaterally Card: RRR, no murmurs, rubs, or gallops Abd: nontender, nondistended Extremities: no calf tenderness, no edema Vascular: 2+ radial pulses bilaterally, 2+ DP pulses bilaterally Neuro: No focal deficits Skin: Multiple bruises noted over the patient's upper and lower extremities Psyc: acting appropriately    (all labs ordered are listed, but only abnormal results are displayed) Labs Reviewed  BASIC METABOLIC PANEL WITH GFR - Abnormal; Notable for the following components:      Result Value   Sodium 128 (*)    Potassium 2.9 (*)    CO2 19 (*)    Glucose, Bld 135 (*)    Calcium 6.3 (*)    All other components within normal limits  CBC - Abnormal; Notable for the following components:   WBC 12.1 (*)    RBC 2.86 (*)    Hemoglobin 9.7 (*)    HCT 27.3 (*)    Platelets 136 (*)    All other components within normal limits  LACTIC ACID, PLASMA - Abnormal; Notable for the  following components:   Lactic Acid, Venous 2.5 (*)    All other components within normal limits  ALBUMIN - Abnormal; Notable for the following components:   Albumin 2.6 (*)    All other components within normal limits  MAGNESIUM - Abnormal; Notable for the following components:   Magnesium 1.5 (*)    All other components within normal limits  LACTIC ACID, PLASMA  TROPONIN I (HIGH SENSITIVITY)  TROPONIN I (HIGH SENSITIVITY)    EKG: None  Radiology: CT Head Wo Contrast Result Date: 09/13/2023 CLINICAL DATA:  Lightheaded with frequent falls. EXAM: CT HEAD WITHOUT CONTRAST TECHNIQUE: Contiguous axial images were obtained from the base  of the skull through the vertex without intravenous contrast. RADIATION DOSE REDUCTION: This exam was performed according to the departmental dose-optimization program which includes automated exposure control, adjustment of the mA and/or kV according to patient size and/or use of iterative reconstruction technique. COMPARISON:  Jul 18, 2021 FINDINGS: Brain: There is generalized cerebral atrophy with widening of the extra-axial spaces and ventricular dilatation. There are areas of decreased attenuation within the white matter tracts of the supratentorial brain, consistent with microvascular disease changes. Vascular: No hyperdense vessel or unexpected calcification. Skull: Normal. Negative for fracture or focal lesion. Sinuses/Orbits: There is mild anterior left ethmoid sinus mucosal thickening. Postoperative changes consistent with prior right mastoidectomy are noted. Other: None. IMPRESSION: 1. 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 2. No acute intracranial abnormality. 3. Evidence of prior right mastoidectomy. Electronically Signed   By: Suzen Dials M.D.   On: 09/13/2023 18:35   DG Chest Port 1 View Result Date: 09/13/2023 CLINICAL DATA:  Shortness of breath EXAM: PORTABLE CHEST 1 VIEW COMPARISON:  08/30/2023 FINDINGS: No acute airspace disease or effusion. Stable cardiomediastinal silhouette with aortic atherosclerosis. No pneumothorax. Probable calcified granuloma in the left lower lung. IMPRESSION: No active disease. Electronically Signed   By: Luke Bun M.D.   On: 09/13/2023 15:53     Procedures   Medications Ordered in the ED  calcium gluconate 1 g/ 50 mL sodium chloride  IVPB (has no administration in time range)  lactated ringers bolus 500 mL (0 mLs Intravenous Stopped 09/13/23 1704)  potassium chloride SA (KLOR-CON M) CR tablet 40 mEq (40 mEq Oral Given 09/13/23 1734)                                    Medical Decision  Making 83 year old male with past medical history of Parkinson's disease, diabetes, hypertension, and hyperlipidemia presenting to the emergency department today after a presyncopal event and fall at home.  The patient was initially hypotensive with medics.  This improved with small fluid bolus.  Assessing to be more of a chronic issue for the patient.  Will further evaluate him here with basic labs to evaluate for anemia or electrolyte abnormalities.  Will also obtain an EKG and keep the patient on the cardiac monitor to evaluate for arrhythmias.  I will give the patient additional 500 mL bolus here.  Will check orthostatics.  Will also obtain a CT scan of his head given his age to evaluate for intracranial injury.  Will also obtain a chest x-ray to evaluate for infectious etiology.  The patient denies any urinary symptoms at this time.  I will reevaluate for ultimate disposition.  This may be due to his underlying Parkinson's that continues to progress.  The patient's labs are  revealing for hypocalcemia.  His corrected calcium is still low at 7.4.  Potassium is low as well.  The patient given oral potassium as well as calcium gluconate.  On reassessment he is still feeling generally weak.  A call is placed to hospital service for admission.  Amount and/or Complexity of Data Reviewed Labs: ordered. Radiology: ordered.  Risk Prescription drug management. Decision regarding hospitalization.        Final diagnoses:  Hypocalcemia  Hypokalemia  Generalized weakness    ED Discharge Orders     None          Ula Prentice SAUNDERS, MD 09/13/23 [phone]

## 2023-09-13 NOTE — ED Triage Notes (Signed)
 EMS reports from [facility], staff called for frequent falls and fall today, no LOC, blood thinners, head strike or obvious injury Staff states decreasing balance issues. EMS reports hypotension on scene at 60s palpated. Pt c/o constipation and lightheadedness for a few days.  BP 127/80 HR 74 RR 18 Sp02 98 RA CBG 204  18 LAC  NS enroute

## 2023-09-13 NOTE — Plan of Care (Signed)
   Problem: Safety: Goal: Ability to remain free from injury will improve Outcome: Progressing   Problem: Skin Integrity: Goal: Risk for impaired skin integrity will decrease Outcome: Progressing

## 2023-09-14 DIAGNOSIS — G20A1 Parkinson's disease without dyskinesia, without mention of fluctuations: Secondary | ICD-10-CM | POA: Diagnosis present

## 2023-09-14 DIAGNOSIS — E876 Hypokalemia: Secondary | ICD-10-CM | POA: Diagnosis present

## 2023-09-14 DIAGNOSIS — I428 Other cardiomyopathies: Secondary | ICD-10-CM | POA: Diagnosis not present

## 2023-09-14 DIAGNOSIS — I10 Essential (primary) hypertension: Secondary | ICD-10-CM | POA: Diagnosis present

## 2023-09-14 DIAGNOSIS — G4700 Insomnia, unspecified: Secondary | ICD-10-CM | POA: Diagnosis present

## 2023-09-14 DIAGNOSIS — Z87898 Personal history of other specified conditions: Secondary | ICD-10-CM | POA: Diagnosis not present

## 2023-09-14 DIAGNOSIS — Z87891 Personal history of nicotine dependence: Secondary | ICD-10-CM | POA: Diagnosis not present

## 2023-09-14 DIAGNOSIS — E871 Hypo-osmolality and hyponatremia: Secondary | ICD-10-CM | POA: Diagnosis not present

## 2023-09-14 DIAGNOSIS — E222 Syndrome of inappropriate secretion of antidiuretic hormone: Secondary | ICD-10-CM | POA: Diagnosis present

## 2023-09-14 DIAGNOSIS — Z79899 Other long term (current) drug therapy: Secondary | ICD-10-CM | POA: Diagnosis not present

## 2023-09-14 DIAGNOSIS — E785 Hyperlipidemia, unspecified: Secondary | ICD-10-CM | POA: Diagnosis not present

## 2023-09-14 DIAGNOSIS — E7800 Pure hypercholesterolemia, unspecified: Secondary | ICD-10-CM | POA: Diagnosis present

## 2023-09-14 DIAGNOSIS — R531 Weakness: Secondary | ICD-10-CM | POA: Diagnosis not present

## 2023-09-14 DIAGNOSIS — E878 Other disorders of electrolyte and fluid balance, not elsewhere classified: Secondary | ICD-10-CM | POA: Diagnosis not present

## 2023-09-14 DIAGNOSIS — I959 Hypotension, unspecified: Secondary | ICD-10-CM | POA: Diagnosis present

## 2023-09-14 DIAGNOSIS — E119 Type 2 diabetes mellitus without complications: Secondary | ICD-10-CM | POA: Diagnosis present

## 2023-09-14 DIAGNOSIS — Z7982 Long term (current) use of aspirin: Secondary | ICD-10-CM | POA: Diagnosis not present

## 2023-09-14 DIAGNOSIS — E860 Dehydration: Secondary | ICD-10-CM | POA: Diagnosis present

## 2023-09-14 DIAGNOSIS — K5901 Slow transit constipation: Secondary | ICD-10-CM | POA: Diagnosis present

## 2023-09-14 DIAGNOSIS — E872 Acidosis, unspecified: Secondary | ICD-10-CM | POA: Diagnosis present

## 2023-09-14 LAB — BASIC METABOLIC PANEL WITH GFR
Anion gap: 10 (ref 5–15)
BUN: 11 mg/dL (ref 8–23)
CO2: 20 mmol/L — ABNORMAL LOW (ref 22–32)
Calcium: 8.7 mg/dL — ABNORMAL LOW (ref 8.9–10.3)
Chloride: 94 mmol/L — ABNORMAL LOW (ref 98–111)
Creatinine, Ser: 1.04 mg/dL (ref 0.61–1.24)
GFR, Estimated: >60 mL/min (ref >60)
Glucose, Bld: 207 mg/dL — ABNORMAL HIGH (ref 70–99)
Potassium: 4.9 mmol/L (ref 3.5–5.1)
Sodium: 124 mmol/L — ABNORMAL LOW (ref 135–145)

## 2023-09-14 LAB — COMPREHENSIVE METABOLIC PANEL WITH GFR
ALT: 7 U/L (ref 0–44)
AST: 19 U/L (ref 15–41)
Albumin: 3.2 g/dL — ABNORMAL LOW (ref 3.5–5.0)
Alkaline Phosphatase: 68 U/L (ref 38–126)
Anion gap: 10 (ref 5–15)
BUN: 11 mg/dL (ref 8–23)
CO2: 19 mmol/L — ABNORMAL LOW (ref 22–32)
Calcium: 9 mg/dL (ref 8.9–10.3)
Chloride: 97 mmol/L — ABNORMAL LOW (ref 98–111)
Creatinine, Ser: 0.85 mg/dL (ref 0.61–1.24)
GFR, Estimated: >60 mL/min (ref >60)
Glucose, Bld: 102 mg/dL — ABNORMAL HIGH (ref 70–99)
Potassium: 4.6 mmol/L (ref 3.5–5.1)
Sodium: 126 mmol/L — ABNORMAL LOW (ref 135–145)
Total Bilirubin: 1.6 mg/dL — ABNORMAL HIGH (ref 0.0–1.2)
Total Protein: 5.7 g/dL — ABNORMAL LOW (ref 6.5–8.1)

## 2023-09-14 LAB — CBC
HCT: 40 % (ref 39.0–52.0)
Hemoglobin: 14 g/dL (ref 13.0–17.0)
MCH: 33 pg (ref 26.0–34.0)
MCHC: 35 g/dL (ref 30.0–36.0)
MCV: 94.3 fL (ref 80.0–100.0)
Platelets: 198 10*3/uL (ref 150–400)
RBC: 4.24 MIL/uL (ref 4.22–5.81)
RDW: 12.4 % (ref 11.5–15.5)
WBC: 9.6 10*3/uL (ref 4.0–10.5)
nRBC: 0 % (ref 0.0–0.2)

## 2023-09-14 LAB — TSH: TSH: 2.059 u[IU]/mL (ref 0.350–4.500)

## 2023-09-14 LAB — SODIUM, URINE, RANDOM: Sodium, Ur: 104 mmol/L

## 2023-09-14 LAB — OSMOLALITY, URINE: Osmolality, Ur: 413 mosm/kg (ref 300–900)

## 2023-09-14 LAB — OSMOLALITY: Osmolality: 267 mosm/kg — ABNORMAL LOW (ref 275–295)

## 2023-09-14 MED ORDER — POLYETHYLENE GLYCOL 3350 17 G PO PACK
17.0000 g | PACK | Freq: Every day | ORAL | Status: DC
  Administered 2023-09-14 – 2023-09-17 (×4): 17 g via ORAL
  Filled 2023-09-14 (×4): qty 1

## 2023-09-14 MED ORDER — SODIUM CHLORIDE 0.9 % IV SOLN: INTRAVENOUS | Status: DC

## 2023-09-14 MED ORDER — HYDRALAZINE HCL 20 MG/ML IJ SOLN: 10.0000 mg | INTRAMUSCULAR | Status: DC | PRN

## 2023-09-14 MED ORDER — METOPROLOL TARTRATE 5 MG/5ML IV SOLN: 5.0000 mg | INTRAVENOUS | Status: DC | PRN

## 2023-09-14 MED ORDER — SODIUM CHLORIDE 1 G PO TABS
1.0000 g | ORAL_TABLET | Freq: Three times a day (TID) | ORAL | Status: DC
  Administered 2023-09-14: 1 g via ORAL
  Filled 2023-09-14: qty 1

## 2023-09-14 MED ORDER — IPRATROPIUM-ALBUTEROL 0.5-2.5 (3) MG/3ML IN SOLN: 3.0000 mL | RESPIRATORY_TRACT | Status: DC | PRN

## 2023-09-14 MED ORDER — GUAIFENESIN 100 MG/5ML PO LIQD: 5.0000 mL | ORAL | Status: DC | PRN

## 2023-09-14 NOTE — Progress Notes (Signed)
 PROGRESS NOTE    Bob Long  FMW:[id number] DOB: 1940/09/21 DOA: 09/13/2023 PCP: Mast, Man X, NP    Brief Narrative:   83 y.o. male with medical history significant of Parkinson's, hypertension, hyperlipidemia who presented to emergency department after a fall.  Patient got up and felt lightheaded.  He has been having an ongoing issues with weakness and lightheadedness.  EMS was called he was given some IV fluids.  He was found to be hypotensive and transported to the ER.  In the ER noted to have multiple abnormal electrolytes including hypokalemia, hypomagnesemia, hyponatremia.  CT head and chest x-ray were negative.  Assessment & Plan:  Principal Problem:   Electrolyte abnormality   Hypokalemia/hyponatremia/hypomagnesemia/hypocalcemia - Aggressive repletion of electrolytes, IV fluids.  Hyponatremia slowly worsening, continue IV fluids.  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  IV as needed  Insomnia Bedtime as needed trazodone  Hyperlipidemia -Lipitor   DVT prophylaxis: enoxaparin (LOVENOX) injection 40 mg Start: 09/14/23 1000 SCDs Start: 09/13/23 2244    Code Status: Full Code Family Communication:  Wife and son at bedside.  Still has multiple ongoing abnormal electrolytes.    Subjective: Still weak. Family at bedside.    Examination:  General exam: Appears calm and comfortable  Respiratory system: Clear to auscultation. Respiratory effort normal. Cardiovascular system: S1 & S2 heard, RRR. No JVD, murmurs, rubs, gallops or clicks. No pedal edema. Gastrointestinal system: Abdomen is nondistended, soft and nontender. No organomegaly or masses felt. Normal bowel sounds heard. Central nervous system: Alert and oriented. No focal neurological deficits. Extremities: Symmetric 5 x 5 power. Skin: No rashes, lesions or ulcers Psychiatry: Judgement and insight appear normal. Mood & affect  appropriate.                Diet Orders (From admission, onward)     Start     Ordered   09/13/23 2244  Diet regular Room service appropriate? Yes; Fluid consistency: Thin  Diet effective now       Question Answer Comment  Room service appropriate? Yes   Fluid consistency: Thin      09/13/23 2243            Objective: Vitals:   09/13/23 2319 09/14/23 0143 09/14/23 0507 09/14/23 0914  BP: (!) 176/88 138/86 (!) 146/78 (!) 148/79  Pulse: 82 79 75 78  Resp: (!) 21 20 18 18   Temp: 98.1 F (36.7 C) 97.7 F (36.5 C) 97.6 F (36.4 C) 97.8 F (36.6 C)  TempSrc: Oral Oral Oral Oral  SpO2:  96% 96% 98%  Weight: 72.2 kg     Height: 5' 8 (1.727 m)       Intake/Output Summary (Last 24 hours) at 09/14/2023 1221 Last data filed at 09/14/2023 0500 Gross per 24 hour  Intake 496.75 ml  Output 350 ml  Net 146.75 ml   Filed Weights   09/13/23 2319  Weight: 72.2 kg    Scheduled Meds:  aspirin EC  81 mg Oral Daily   atorvastatin  20 mg Oral Daily   carbidopa-levodopa  1 tablet Oral BID   And   carbidopa-levodopa  2 tablet Oral QPC lunch   enoxaparin (LOVENOX) injection  40 mg Subcutaneous Q24H   irbesartan  150 mg Oral Daily   Continuous Infusions:  sodium chloride  100 mL/hr at 09/14/23 1031    Nutritional status     Body mass index is 24.2 kg/m.  Data  Reviewed:   CBC: Recent Labs  Lab 09/13/23 1538 09/14/23 0547  WBC 12.1* 9.6  HGB 9.7* 14.0  HCT 27.3* 40.0  MCV 95.5 94.3  PLT 136* 198   Basic Metabolic Panel: Recent Labs  Lab 09/13/23 1538 09/14/23 0547  NA 128* 126*  K 2.9* 4.6  CL 104 97*  CO2 19* 19*  GLUCOSE 135* 102*  BUN 11 11  CREATININE 0.85 0.85  CALCIUM 6.3* 9.0  MG 1.5*  --    GFR: Estimated Creatinine Clearance: 64.8 mL/min (by C-G formula based on SCr of 0.85 mg/dL). Liver Function Tests: Recent Labs  Lab 09/13/23 1538 09/14/23 0547  AST  --  19  ALT  --  7  ALKPHOS  --  68  BILITOT  --  1.6*  PROT  --  5.7*   ALBUMIN 2.6* 3.2*   No results for input(s): LIPASE, AMYLASE in the last 168 hours. No results for input(s): AMMONIA in the last 168 hours. Coagulation Profile: No results for input(s): INR, PROTIME in the last 168 hours. Cardiac Enzymes: No results for input(s): CKTOTAL, CKMB, CKMBINDEX, TROPONINI in the last 168 hours. BNP (last 3 results) No results for input(s): PROBNP in the last 8760 hours. HbA1C: No results for input(s): HGBA1C in the last 72 hours. CBG: No results for input(s): GLUCAP in the last 168 hours. Lipid Profile: No results for input(s): CHOL, HDL, LDLCALC, TRIG, CHOLHDL, LDLDIRECT in the last 72 hours. Thyroid Function Tests: No results for input(s): TSH, T4TOTAL, FREET4, T3FREE, THYROIDAB in the last 72 hours. Anemia Panel: No results for input(s): VITAMINB12, FOLATE, FERRITIN, TIBC, IRON, RETICCTPCT in the last 72 hours. Sepsis Labs: Recent Labs  Lab 09/13/23 1538 09/13/23 1737  LATICACIDVEN 2.5* 1.5    No results found for this or any previous visit (from the past 240 hours).       Radiology Studies: CT Head Wo Contrast Result Date: 09/13/2023 CLINICAL DATA:  Lightheaded with frequent falls. EXAM: CT HEAD WITHOUT CONTRAST TECHNIQUE: Contiguous axial images were obtained from the base of the skull through the vertex without intravenous contrast. RADIATION DOSE REDUCTION: This exam was performed according to the departmental dose-optimization program which includes automated exposure control, adjustment of the mA and/or kV according to patient size and/or use of iterative reconstruction technique. COMPARISON:  Jul 18, 2021 FINDINGS: Brain: There is generalized cerebral atrophy with widening of the extra-axial spaces and ventricular dilatation. There are areas of decreased attenuation within the white matter tracts of the supratentorial brain, consistent with microvascular disease changes. Vascular: No  hyperdense vessel or unexpected calcification. Skull: Normal. Negative for fracture or focal lesion. Sinuses/Orbits: There is mild anterior left ethmoid sinus mucosal thickening. Postoperative changes consistent with prior right mastoidectomy are noted. Other: None. IMPRESSION: 1. 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 2. No acute intracranial abnormality. 3. Evidence of prior right mastoidectomy. Electronically Signed   By: Suzen Dials M.D.   On: 09/13/2023 18:35   DG Chest Port 1 View Result Date: 09/13/2023 CLINICAL DATA:  Shortness of breath EXAM: PORTABLE CHEST 1 VIEW COMPARISON:  08/30/2023 FINDINGS: No acute airspace disease or effusion. Stable cardiomediastinal silhouette with aortic atherosclerosis. No pneumothorax. Probable calcified granuloma in the left lower lung. IMPRESSION: No active disease. Electronically Signed   By: Luke Bun M.D.   On: 09/13/2023 15:53           LOS: 0 days   Time spent= 35 mins    Valma Rotenberg C Tanijah Morais,  MD [facility]  If 7PM-7AM, please contact night-coverage  09/14/2023, 12:21 PM

## 2023-09-14 NOTE — Care Management Obs Status (Addendum)
 MEDICARE OBSERVATION STATUS NOTIFICATION   Patient Details  Name: Bob Long MRN: [id number] Date of Birth: [birth year]   Medicare Observation Status Notification Given:  Yes    Sheri ONEIDA Sharps, LCSW 09/14/2023, 4:19 PM

## 2023-09-14 NOTE — Evaluation (Signed)
 Physical Therapy Evaluation Patient Details Name: Bob Long MRN: [id number] DOB: 02/20/1941 Today's Date: 09/14/2023  History of Present Illness  83 y.o. male  presents from home on 09/13/23 after multiple falls, dizziness.Found to be hypotensive ,abnormal electrolytes including hypokalemia, hypomagnesemia, hyponatremia. PMH: Parkinson's, hypertension, hyperlipidemia, reports  vertigo  Clinical Impression  Pt admitted with above diagnosis.  Pt currently with functional limitations due to the deficits listed below (see PT Problem List). Pt will benefit from acute skilled PT to increase their independence and safety with mobility to allow discharge.      The patient required mod/max assist to sitting  then to stand with mod support at [facility]. Patient ambulated x 40' using Rw and +2 for safety. Noted tends to veer to the right.  Shuffling at times, stops to regain  control of steps.  Patient was mod I in apartment using a rollator day prior to fall. Wife and son present. Patient will benefit from continued inpatient follow up therapy, <3 hours/day       If plan is discharge home, recommend the following: A little help with walking and/or transfers;A little help with bathing/dressing/bathroom;Assistance with cooking/housework;Assist for transportation;Help with stairs or ramp for entrance   Can travel by private vehicle   Yes    Equipment Recommendations None recommended by PT  Recommendations for Other Services       Functional Status Assessment Patient has had a recent decline in their functional status and demonstrates the ability to make significant improvements in function in a reasonable and predictable amount of time.     Precautions / Restrictions Precautions Precautions: Fall      Mobility  Bed Mobility Overal bed mobility: Needs Assistance Bed Mobility: Rolling, Sidelying to Sit Rolling: Mod assist, Used rails Sidelying to sit: Mod assist       General bed mobility  comments: multimodal cues to roll, reach for  rail, assist to reach rail, mod assist to sit upright, Patient attempted to self  push to bed edge but rquired max support and bed  pad to get feet on the floor.    Transfers Overall transfer level: Needs assistance Equipment used: Rolling walker (2 wheels) Transfers: Sit to/from Stand Sit to Stand: +2 physical assistance, +2 safety/equipment, Mod assist           General transfer comment: ssist to power up to stand at [facility].    Ambulation/Gait Ambulation/Gait assistance: Min assist, +2 safety/equipment Gait Distance (Feet): 40 Feet Assistive device: Rolling walker (2 wheels) Gait Pattern/deviations: Step-to pattern, Step-through pattern, Festinating Gait velocity: decr     General Gait Details: intermittent  festinating/ stumbles, Cues for posture, tends to list towards right.  Stairs            Wheelchair Mobility     Tilt Bed    Modified Rankin (Stroke Patients Only)       Balance Overall balance assessment: Needs assistance, History of Falls Sitting-balance support: Feet supported, Bilateral upper extremity supported Sitting balance-Leahy Scale: Poor   Postural control: Posterior lean Standing balance support: Bilateral upper extremity supported, During functional activity, Reliant on assistive device for balance Standing balance-Leahy Scale: Poor                               Pertinent Vitals/Pain Pain Assessment Pain Assessment: No/denies pain    Home Living Family/patient expects to be discharged to:: Private residence Living Arrangements: Spouse/significant other;Children Available Help at Discharge: Family;Available  24 hours/day Type of Home: Independent living facility Home Access: Level entry       Home Layout: One level Home Equipment: Rollator (4 wheels)      Prior Function Prior Level of Function : Needs assist             Mobility Comments: independent with rollator in  APT ADLs Comments: assisted PRN     Extremity/Trunk Assessment   Upper Extremity Assessment Upper Extremity Assessment: Generalized weakness    Lower Extremity Assessment Lower Extremity Assessment: Generalized weakness    Cervical / Trunk Assessment Cervical / Trunk Assessment: Normal  Communication   Communication Communication: Impaired Factors Affecting Communication: Hearing impaired    Cognition Arousal: Alert Behavior During Therapy: Flat affect   PT - Cognitive impairments: No apparent impairments, Problem solving, Sequencing                       PT - Cognition Comments: multimodal cues for rolling, to move to sitting Following commands: Intact       Cueing Cueing Techniques: Verbal cues, Tactile cues     General Comments      Exercises     Assessment/Plan    PT Assessment Patient needs continued PT services  PT Problem List Decreased strength;Decreased mobility;Decreased safety awareness;Decreased coordination;Decreased activity tolerance;Decreased balance       PT Treatment Interventions DME instruction;Therapeutic activities;Gait training;Therapeutic exercise;Patient/family education;Functional mobility training;Balance training    PT Goals (Current goals can be found in the Care Plan section)  Acute Rehab PT Goals Patient Stated Goal: to be able to walk PT Goal Formulation: With patient/family Time For Goal Achievement: 09/28/23 Potential to Achieve Goals: Good    Frequency Min 2X/week     Co-evaluation               AM-PAC PT 6 Clicks Mobility  Outcome Measure Help needed turning from your back to your side while in a flat bed without using bedrails?: A Lot Help needed moving from lying on your back to sitting on the side of a flat bed without using bedrails?: A Lot Help needed moving to and from a bed to a chair (including a wheelchair)?: A Lot Help needed standing up from a chair using your arms (e.g., wheelchair or  bedside chair)?: A Lot Help needed to walk in hospital room?: A Lot Help needed climbing 3-5 steps with a railing? : Total 6 Click Score: 11    End of Session Equipment Utilized During Treatment: Gait belt Activity Tolerance: Patient tolerated treatment well Patient left: in chair;with call bell/phone within reach;with family/visitor present Nurse Communication: Mobility status PT Visit Diagnosis: Unsteadiness on feet (R26.81);Muscle weakness (generalized) (M62.81);Difficulty in walking, not elsewhere classified (R26.2);Repeated falls (R29.6)    Time: 8884-8868 PT Time Calculation (min) (ACUTE ONLY): 16 min   Charges:   PT Evaluation $PT Eval Low Complexity: 1 Low   PT General Charges $$ ACUTE PT VISIT: 1 Visit         Darice Potters PT Acute Rehabilitation Services Office [phone]   Potters Darice Norris 09/14/2023, 1:45 PM

## 2023-09-14 NOTE — Plan of Care (Signed)

## 2023-09-14 NOTE — TOC Initial Note (Signed)
 Transition of Care [facility]) - Initial/Assessment Note    Patient Details  Name: Bob Long MRN: [id number] Date of Birth: 13-Jun-1940  Transition of Care [facility]) CM/SW Contact:    Sheri ONEIDA Sharps, LCSW Phone Number: 09/14/2023, 4:21 PM  Clinical Narrative:                 Pt from [facility]. Pt recommended for SNF. Pt accepting of recommendation and will be going to SNF at [facility]. CSW notified Katy at [facility]. Ins auth started and approved.   Expected Discharge Plan: Skilled Nursing Facility Barriers to Discharge: No Barriers Identified   Patient Goals and CMS Choice Patient states their goals for this hospitalization and ongoing recovery are:: return to [facility] for SNF CMS Medicare.gov Compare Post Acute Care list provided to:: Patient Choice offered to / list presented to : Patient [place] ownership interest in [facility].provided to:: Patient    Expected Discharge Plan and Services In-house Referral: NA Discharge Planning Services: NA Post Acute Care Choice: Skilled Nursing Facility Living arrangements for the past 2 months: Independent Living Facility                 DME Arranged: N/A DME Agency: NA       HH Arranged: NA HH Agency: NA        Prior Living Arrangements/Services Living arrangements for the past 2 months: Independent Living Facility Lives with:: Spouse Patient language and need for interpreter reviewed:: Yes Do you feel safe going back to the place where you live?: Yes      Need for Family Participation in Patient Care: Yes (Comment) Care giver support system in place?: Yes (comment)   Criminal Activity/Legal Involvement Pertinent to Current Situation/Hospitalization: No - Comment as needed  Activities of Daily Living   ADL Screening (condition at time of admission) Independently performs ADLs?: No Does the patient have a NEW difficulty with bathing/dressing/toileting/self-feeding that is expected to  last >3 days?: No Does the patient have a NEW difficulty with getting in/out of bed, walking, or climbing stairs that is expected to last >3 days?: No Does the patient have a NEW difficulty with communication that is expected to last >3 days?: No Is the patient deaf or have difficulty hearing?: No Does the patient have difficulty seeing, even when wearing glasses/contacts?: No Does the patient have difficulty concentrating, remembering, or making decisions?: No  Permission Sought/Granted                  Emotional Assessment Appearance:: Appears stated age Attitude/Demeanor/Rapport: Engaged Affect (typically observed): Accepting Orientation: : Oriented to Self, Oriented to Place, Oriented to  Time, Oriented to Situation Alcohol / Substance Use: Not Applicable Psych Involvement: No (comment)  Admission diagnosis:  Hypocalcemia [E83.51] Hypokalemia [E87.6] Electrolyte abnormality [E87.8] Generalized weakness [R53.1] Patient Active Problem List   Diagnosis Date Noted   Electrolyte abnormality 09/13/2023   Hyponatremia 08/02/2023   Parkinson's disease (HCC) 07/15/2023   Anxiety 07/15/2023   HLD (hyperlipidemia) 07/15/2023   History of syncope 07/15/2023   HTN (hypertension) 07/15/2023   Type 2 diabetes mellitus (HCC) 07/15/2023   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   PCP:  Mast, Man X, NP Pharmacy:   [id number] [address], [place] - [address] [place] [address] Phone: [phone] Fax: [phone]     Social Drivers of Health (SDOH) Social History: SDOH Screenings   Food Insecurity: No Food Insecurity (09/13/2023)  Housing: Low Risk  (  09/13/2023)  Transportation Needs: No Transportation Needs (09/13/2023)  Utilities: Not At Risk (09/13/2023)  Depression (PHQ2-9): Low Risk  (07/15/2023)  Financial Resource Strain: Low Risk  (07/11/2021)   Received from [facility], [facility] visits prior to 05/16/2022.  Physical  Activity: Unknown (07/11/2021)   Received from [facility], [facility] visits prior to 05/16/2022.  Social Connections: Socially Isolated (09/13/2023)  Stress: No Stress Concern Present (07/11/2021)   Received from [facility], [facility] visits prior to 05/16/2022.  Tobacco Use: Medium Risk (09/13/2023)   SDOH Interventions:     Readmission Risk Interventions    09/14/2023    4:17 PM  Readmission Risk Prevention Plan  Post Dischage Appt Complete  Medication Screening Complete  Transportation Screening Complete

## 2023-09-14 NOTE — Hospital Course (Addendum)
 Brief Narrative:   83 y.o. male with medical history significant of Parkinson's, hypertension, hyperlipidemia who presented to emergency department after a fall.  Patient got up and felt lightheaded.  He has been having an ongoing issues with weakness and lightheadedness.  EMS was called he was given some IV fluids.  He was found to be hypotensive and transported to the ER.  In the ER noted to have multiple abnormal electrolytes including hypokalemia, hypomagnesemia, hyponatremia.  CT head and chest x-ray were negative.  Assessment & Plan:  Principal Problem:   Electrolyte abnormality   Hypokalemia/hyponatremia/hypomagnesemia/hypocalcemia - Aggressive repletion of electrolytes, IV fluids.  Hyponatremia slowly worsening, continue IV fluids.  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  IV as needed  Insomnia Bedtime as needed trazodone  Hyperlipidemia -Lipitor   DVT prophylaxis: enoxaparin (LOVENOX) injection 40 mg Start: 09/14/23 1000 SCDs Start: 09/13/23 2244    Code Status: Full Code Family Communication:  Wife and son at bedside.  Still has multiple ongoing abnormal electrolytes.    Subjective: Still weak. Family at bedside.    Examination:  General exam: Appears calm and comfortable  Respiratory system: Clear to auscultation. Respiratory effort normal. Cardiovascular system: S1 & S2 heard, RRR. No JVD, murmurs, rubs, gallops or clicks. No pedal edema. Gastrointestinal system: Abdomen is nondistended, soft and nontender. No organomegaly or masses felt. Normal bowel sounds heard. Central nervous system: Alert and oriented. No focal neurological deficits. Extremities: Symmetric 5 x 5 power. Skin: No rashes, lesions or ulcers Psychiatry: Judgement and insight appear normal. Mood & affect appropriate.

## 2023-09-15 ENCOUNTER — Inpatient Hospital Stay (HOSPITAL_COMMUNITY)

## 2023-09-15 DIAGNOSIS — I428 Other cardiomyopathies: Secondary | ICD-10-CM | POA: Diagnosis not present

## 2023-09-15 DIAGNOSIS — E871 Hypo-osmolality and hyponatremia: Secondary | ICD-10-CM | POA: Diagnosis not present

## 2023-09-15 DIAGNOSIS — E876 Hypokalemia: Secondary | ICD-10-CM | POA: Diagnosis not present

## 2023-09-15 DIAGNOSIS — Z87898 Personal history of other specified conditions: Secondary | ICD-10-CM | POA: Diagnosis not present

## 2023-09-15 LAB — ECHOCARDIOGRAM COMPLETE
AR max vel: 1.17 cm2
AV Area VTI: 1.53 cm2
AV Area mean vel: 1.29 cm2
AV Mean grad: 11 mmHg
AV Peak grad: 19.4 mmHg
Ao pk vel: 2.2 m/s
Area-P 1/2: 2.54 cm2
Height: 68 in
S' Lateral: 3 cm
Weight: 2546.75 [oz_av]

## 2023-09-15 LAB — MAGNESIUM: Magnesium: 1.8 mg/dL (ref 1.7–2.4)

## 2023-09-15 LAB — CBC
HCT: 39.9 % (ref 39.0–52.0)
Hemoglobin: 13.9 g/dL (ref 13.0–17.0)
MCH: 33 pg (ref 26.0–34.0)
MCHC: 34.8 g/dL (ref 30.0–36.0)
MCV: 94.8 fL (ref 80.0–100.0)
Platelets: 186 10*3/uL (ref 150–400)
RBC: 4.21 MIL/uL — ABNORMAL LOW (ref 4.22–5.81)
RDW: 12.7 % (ref 11.5–15.5)
WBC: 11.1 10*3/uL — ABNORMAL HIGH (ref 4.0–10.5)
nRBC: 0 % (ref 0.0–0.2)

## 2023-09-15 LAB — PHOSPHORUS: Phosphorus: 2.9 mg/dL (ref 2.5–4.6)

## 2023-09-15 LAB — BASIC METABOLIC PANEL WITH GFR
Anion gap: 8 (ref 5–15)
BUN: 12 mg/dL (ref 8–23)
CO2: 22 mmol/L (ref 22–32)
Calcium: 8.3 mg/dL — ABNORMAL LOW (ref 8.9–10.3)
Chloride: 98 mmol/L (ref 98–111)
Creatinine, Ser: 0.86 mg/dL (ref 0.61–1.24)
GFR, Estimated: >60 mL/min (ref >60)
Glucose, Bld: 112 mg/dL — ABNORMAL HIGH (ref 70–99)
Potassium: 4.3 mmol/L (ref 3.5–5.1)
Sodium: 128 mmol/L — ABNORMAL LOW (ref 135–145)

## 2023-09-15 MED ORDER — LACTULOSE 10 GM/15ML PO SOLN
20.0000 g | Freq: Once | ORAL | Status: AC
  Administered 2023-09-15: 20 g via ORAL
  Filled 2023-09-15: qty 30

## 2023-09-15 MED ORDER — SODIUM CHLORIDE 1 G PO TABS
2.0000 g | ORAL_TABLET | Freq: Three times a day (TID) | ORAL | Status: DC
  Administered 2023-09-15 – 2023-09-17 (×8): 2 g via ORAL
  Filled 2023-09-15 (×8): qty 2

## 2023-09-15 NOTE — Progress Notes (Deleted)
 Assessment/Plan:    ***Multiple syncopal episodes  - Syncope started prior to the diagnosis of Parkinson's disease.  His syncopal episodes started in 2023 and he has had episodes in 2023, 2024 and now in 2025.  Cardiology has evaluated the patient and they do not think that these are related to orthostasis, but have not necessarily found the reason.  He is a little bit out of the age range for MSA and my suspicion for this diagnosis is low.  Subjective:   Bob Long was seen today in the movement disorders clinic for neurologic consultation at the request of Mast, Man X, NP.  The consultation is for the evaluation of Parkinsons disease.  Patient seen previously in [facility] by Dr. Scot.  He has been seeing her since July, 2023.  Patient's first symptoms for gait instability and falls and syncope.  On his first visit, the patient was started carbidopa/levodopa 25/100, 1 tablet 3 times per day.  Patient followed up 1 month later and was told to increase his medication to 4 times per day.  By March, 2024, the patient had another syncopal episode.  Patient was last seen in [facility] by Dr. Scot on August 16, 2023.  No changes were made to medication at that point in time.  Patient was back in the emergency room June 16 after a syncopal episode.  Notes from that episode indicate that the patient was showering during that episode and had a syncopal/near syncopal episode.  He was seen in the emergency room and dismissed.  He followed back up with his nurse practitioner at friend's home.  She noted that the patient was on Diovan, but noted that there have been no orthostatic blood pressure changes.  They noted cardiology had evaluated the patient with an unremarkable workup.  I have reviewed cardiology's notes and they indicated that the symptoms do not really sound orthostatic but with Parkinson's disease we might consider trying droxidopa if he continues to have symptoms.  This was about 1 year  ago.  Patient was admitted to the hospital June 30 after a near syncopal episode.  He became lightheaded and fell.  Notes from the hospital indicate that he was hypotensive when EMS arrived.  He was given a fluid bolus.  He was hypertensive in the emergency room.  He was also hypokalemic, hyponatremic, hypocalcemic.   Specific Symptoms:  Tremor: {yes no:314532} Family hx of similar:  {yes no:314532} Voice: *** Sleep: ***  Vivid Dreams:  {yes no:314532}  Acting out dreams:  {yes no:314532} Wet Pillows: {yes no:314532} Postural symptoms:  {yes no:314532}  Falls?  {yes no:314532} Bradykinesia symptoms: {parkinson brady:18041} Loss of smell:  {yes no:314532} Loss of taste:  {yes no:314532} Urinary Incontinence:  {yes no:314532} Difficulty Swallowing:  {yes no:314532} Handwriting, micrographia: {yes no:314532} Trouble with ADL's:  {yes no:314532}  Trouble buttoning clothing: {yes no:314532} Depression:  {yes no:314532} Memory changes:  {yes no:314532} Hallucinations:  {yes no:314532}  visual distortions: {yes no:314532} N/V:  {yes no:314532} Lightheaded:  {yes no:314532}  Syncope: {yes no:314532} these preceded the diagnosis of Parkinson's disease.  Patient had syncopal episode May, 2023, March, 2024, June 2025 Diplopia:  {yes wn:[id number]} Dyskinesia:  {yes no:314532} Prior exposure to reglan/antipsychotics: {yes no:314532}  Neuroimaging of the brain has *** previously been performed.  It *** available for my review today.  PREVIOUS MEDICATIONS: {Parkinson's RX:18200}  ALLERGIES:  No Known Allergies  CURRENT MEDICATIONS:  No outpatient medications have been marked as taking for the 09/21/23 encounter (  Appointment) with Tripton Ned, Asberry RAMAN, DO.     Objective:   VITALS:  There were no vitals filed for this visit.  GEN:  The patient appears stated age and is in NAD. HEENT:  Normocephalic, atraumatic.  The mucous membranes are moist. The superficial temporal arteries are without ropiness  or tenderness. CV:  RRR Lungs:  CTAB Neck/HEME:  There are no carotid bruits bilaterally.  Neurological examination:  Orientation: The patient is alert and oriented x3.  Cranial nerves: There is good facial symmetry. Extraocular muscles are intact. The visual fields are full to confrontational testing. The speech is fluent and clear. Soft palate rises symmetrically and there is no tongue deviation. Hearing is intact to conversational tone. Sensation: Sensation is intact to light and pinprick throughout (facial, trunk, extremities). Vibration is intact at the bilateral big toe. There is no extinction with double simultaneous stimulation. There is no sensory dermatomal level identified. Motor: Strength is 5/5 in the bilateral upper and lower extremities.   Shoulder shrug is equal and symmetric.  There is no pronator drift. Deep tendon reflexes: Deep tendon reflexes are 2/4 at the bilateral biceps, triceps, brachioradialis, patella and achilles. Plantar responses are downgoing bilaterally.  Movement examination: Tone: There is ***tone in the bilateral upper extremities.  The tone in the lower extremities is ***.  Abnormal movements: *** Coordination:  There is *** decremation with RAM's, *** Gait and Station: The patient has *** difficulty arising out of a deep-seated chair without the use of the hands. The patient's stride length is ***.  The patient has a *** pull test.     I have reviewed and interpreted the following labs independently   Chemistry      Component Value Date/Time   NA 128 (L) 09/15/2023 0550   K 4.3 09/15/2023 0550   CL 98 09/15/2023 0550   CO2 22 09/15/2023 0550   BUN 12 09/15/2023 0550   CREATININE 0.86 09/15/2023 0550   CREATININE 1.00 07/27/2023 0809      Component Value Date/Time   CALCIUM 8.3 (L) 09/15/2023 0550   ALKPHOS 68 09/14/2023 0547   AST 19 09/14/2023 0547   ALT 7 09/14/2023 0547   BILITOT 1.6 (H) 09/14/2023 0547      Lab Results  Component Value  Date   TSH 2.059 09/14/2023   Lab Results  Component Value Date   WBC 11.1 (H) 09/15/2023   HGB 13.9 09/15/2023   HCT 39.9 09/15/2023   MCV 94.8 09/15/2023   PLT 186 09/15/2023     Total time spent on today's visit was ***60 minutes, including both face-to-face time and nonface-to-face time.  Time included that spent on review of records (prior notes available to me/labs/imaging if pertinent), discussing treatment and goals, answering patient's questions and coordinating care.  Cc:  Mast, Man X, NP

## 2023-09-15 NOTE — TOC Progression Note (Signed)
 Transition of Care [facility]) - Progression Note    Patient Details  Name: Bob Long MRN: [id number] Date of Birth: 16-Feb-1941  [facility] CM/SW Contact  Sheri ONEIDA Sharps, LCSW Phone Number: 09/15/2023, 10:32 AM  Clinical Narrative:    CSW PASRR obtained and FL2 completed.    Expected Discharge Plan: Skilled Nursing Facility Barriers to Discharge: No Barriers Identified  Expected Discharge Plan and Services In-house Referral: NA Discharge Planning Services: NA Post Acute Care Choice: Skilled Nursing Facility Living arrangements for the past 2 months: Independent Living Facility                 DME Arranged: N/A DME Agency: NA       HH Arranged: NA HH Agency: NA         Social Determinants of Health (SDOH) Interventions SDOH Screenings   Food Insecurity: No Food Insecurity (09/13/2023)  Housing: Low Risk  (09/13/2023)  Transportation Needs: No Transportation Needs (09/13/2023)  Utilities: Not At Risk (09/13/2023)  Depression (PHQ2-9): Low Risk  (07/15/2023)  Financial Resource Strain: Low Risk  (07/11/2021)   Received from [facility], [facility] visits prior to 05/16/2022.  Physical Activity: Unknown (07/11/2021)   Received from [facility], [facility] visits prior to 05/16/2022.  Social Connections: Socially Isolated (09/13/2023)  Stress: No Stress Concern Present (07/11/2021)   Received from [facility], [facility] visits prior to 05/16/2022.  Tobacco Use: Medium Risk (09/13/2023)    Readmission Risk Interventions    09/14/2023    4:17 PM  Readmission Risk Prevention Plan  Post Dischage Appt Complete  Medication Screening Complete  Transportation Screening Complete

## 2023-09-15 NOTE — Progress Notes (Addendum)
 [facility]                                                                              Bob Long, is a 83 y.o. male, DOB - September 13, 1940, FMW:[id number] Admit date - 09/13/2023    Outpatient Primary MD for the patient is Mast, Man X, NP  LOS - 1  days  Chief Complaint  Patient presents with   Fall   Hypotension       Brief summary   Patient is a 83 year old male with Parkinson's, hypertension, hyperlipidemia who presented to emergency department after a fall. Patient got up and felt lightheaded. He has been having an ongoing issues with weakness and lightheadedness. EMS was called he was given some IV fluids. He was found to be hypotensive and transported to the ER. In the ER noted to have multiple abnormal electrolytes including hypokalemia, hypomagnesemia, hyponatremia. CT head and chest x-ray were negative.    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 - multifactorial, has been having ongoing issues with weakness and lightheadedness, sodium was 128, subsequently placed on IV fluids and trended down to 124 on 7/1 - Serum osmolarity 267, urine osmolarity 413, urine sodium 104, points towards SIADH pattern - DC IV fluids, placed on salt tabs, increase to 2 g 3 times daily, sodium improving   Active Problems: Hypokalemia, hypomagnesemia -Resolved  Hypocalcemia - Improved     Parkinson's disease (HCC) -Continue Sinemet     HLD (hyperlipidemia) -Continue Lipitor    HTN (hypertension) -Continue Avapro    Type 2 diabetes mellitus (HCC) - Not on any medications, hemoglobin A1c 6.7 on 07/27/2023 Fasting CBG 112 this morning     Slow transit constipation - Feels constipated, lactulose x 1, continue MiraLAX  Near syncope, fatigue, weakness 2D echo showed EF of 65 to 70%, G1 DD, no WMA  Estimated body mass index is 24.2 kg/m as calculated from the following:   Height as of this encounter: 5' 8 (1.727 m).   Weight as  of this encounter: 72.2 kg.  Code Status: Full code DVT Prophylaxis:  enoxaparin (LOVENOX) injection 40 mg Start: 09/14/23 1000 SCDs Start: 09/13/23 2244   Level of Care: Level of care: Med-Surg Family Communication: Updated patient's wife and daughter at the bedside Disposition Plan:      Remains inpatient appropriate: Hopefully DC to SNF in a.m. if sodium continues to improve   Procedures:  None  Consultants:   None  Antimicrobials:   Anti-infectives (From admission, onward)    None          Medications  aspirin EC  81 mg Oral Daily   atorvastatin  20 mg Oral Daily   carbidopa-levodopa  1 tablet Oral BID   And   carbidopa-levodopa  2 tablet Oral QPC lunch   enoxaparin (LOVENOX) injection  40 mg Subcutaneous Q24H   irbesartan  150 mg Oral Daily   polyethylene glycol  17 g Oral Daily   sodium chloride   2 g Oral TID WC      Subjective:   Bob Long was seen and examined today.  Per family at  the bedside, patient feels better.  Now acute dizziness, chest pain, shortness of breath, abdominal pain nausea vomiting.  Feels constipated.   Objective:   Vitals:   09/14/23 0914 09/14/23 1334 09/14/23 1925 09/15/23 0512  BP: (!) 148/79 (!) 147/72 (!) 147/84 (!) 148/82  Pulse: 78 80 72 72  Resp: 18 16 20 20   Temp: 97.8 F (36.6 C) 97.9 F (36.6 C) 98.1 F (36.7 C) 98 F (36.7 C)  TempSrc: Oral Oral    SpO2: 98% 96% 99% 98%  Weight:      Height:        Intake/Output Summary (Last 24 hours) at 09/15/2023 1338 Last data filed at 09/15/2023 1100 Gross per 24 hour  Intake 2375.23 ml  Output 1200 ml  Net 1175.23 ml     Wt Readings from Last 3 Encounters:  09/13/23 72.2 kg  08/12/23 73.9 kg  07/15/23 74.4 kg     Exam General: Alert and oriented x 3, NAD Cardiovascular: S1 S2 auscultated,  RRR Respiratory: Clear to auscultation bilaterally Gastrointestinal: Soft, nontender, nondistended, + bowel sounds Ext: no pedal edema bilaterally Neuro: no new  deficits Psych: Normal affect     Data Reviewed:  I have personally reviewed following labs    CBC Lab Results  Component Value Date   WBC 11.1 (H) 09/15/2023   RBC 4.21 (L) 09/15/2023   HGB 13.9 09/15/2023   HCT 39.9 09/15/2023   MCV 94.8 09/15/2023   MCH 33.0 09/15/2023   PLT 186 09/15/2023   MCHC 34.8 09/15/2023   RDW 12.7 09/15/2023   LYMPHSABS 0.8 08/30/2023   MONOABS 0.7 08/30/2023   EOSABS 0.1 08/30/2023   BASOSABS 0.1 08/30/2023     Last metabolic panel Lab Results  Component Value Date   NA 128 (L) 09/15/2023   K 4.3 09/15/2023   CL 98 09/15/2023   CO2 22 09/15/2023   BUN 12 09/15/2023   CREATININE 0.86 09/15/2023   GLUCOSE 112 (H) 09/15/2023   GFRNONAA >60 09/15/2023   CALCIUM 8.3 (L) 09/15/2023   PHOS 2.9 09/15/2023   PROT 5.7 (L) 09/14/2023   ALBUMIN 3.2 (L) 09/14/2023   BILITOT 1.6 (H) 09/14/2023   ALKPHOS 68 09/14/2023   AST 19 09/14/2023   ALT 7 09/14/2023   ANIONGAP 8 09/15/2023    CBG (last 3)  No results for input(s): GLUCAP in the last 72 hours.    Coagulation Profile: No results for input(s): INR, PROTIME in the last 168 hours.   Radiology Studies: I have personally reviewed the imaging studies  ECHOCARDIOGRAM COMPLETE Result Date: 09/15/2023    ECHOCARDIOGRAM REPORT   Patient Name:   Bob Long Date of Exam: 09/15/2023 Medical Rec #:  [id number]       Height:       68.0 in Accession #:    [id number]      Weight:       159.2 lb Date of Birth:  1941/01/13        BSA:          1.855 m Patient Age:    83 years        BP:           148/82 mmHg Patient Gender: M               HR:           79 bpm. Exam Location:  Inpatient Procedure: 2D Echo, Color Doppler and Cardiac Doppler (Both Spectral and Color  Flow Doppler were utilized during procedure). Indications:    Cardiomyopathy Unspecified I42.9  History:        Patient has no prior history of Echocardiogram examinations.                 Risk Factors:Diabetes and Hypertension.   Sonographer:    Tinnie Gosling RDCS Referring Phys: [id number] BURGESS BROCKS AMIN IMPRESSIONS  1. Left ventricular ejection fraction, by estimation, is 65 to 70%. The left ventricle has normal function. The left ventricle has no regional wall motion abnormalities. There is mild concentric left ventricular hypertrophy. Left ventricular diastolic parameters are consistent with Grade I diastolic dysfunction (impaired relaxation).  2. Right ventricular systolic function is mildly reduced. The right ventricular size is mildly enlarged. There is normal pulmonary artery systolic pressure. The estimated right ventricular systolic pressure is 31.3 mmHg.  3. The mitral valve is normal in structure. No evidence of mitral valve regurgitation. No evidence of mitral stenosis.  4. The aortic valve is tricuspid. There is moderate calcification of the aortic valve. Aortic valve regurgitation is trivial. Mild aortic valve stenosis. Aortic valve area, by VTI measures 1.53 cm. Aortic valve mean gradient measures 11.0 mmHg.  5. The inferior vena cava is normal in size with greater than 50% respiratory variability, suggesting right atrial pressure of 3 mmHg. FINDINGS  Left Ventricle: Left ventricular ejection fraction, by estimation, is 65 to 70%. The left ventricle has normal function. The left ventricle has no regional wall motion abnormalities. The left ventricular internal cavity size was normal in size. There is  mild concentric left ventricular hypertrophy. Left ventricular diastolic parameters are consistent with Grade I diastolic dysfunction (impaired relaxation). Right Ventricle: The right ventricular size is mildly enlarged. No increase in right ventricular wall thickness. Right ventricular systolic function is mildly reduced. There is normal pulmonary artery systolic pressure. The tricuspid regurgitant velocity  is 2.66 m/s, and with an assumed right atrial pressure of 3 mmHg, the estimated right ventricular systolic pressure is 31.3  mmHg. Left Atrium: Left atrial size was normal in size. Right Atrium: Right atrial size was normal in size. Pericardium: There is no evidence of pericardial effusion. Mitral Valve: The mitral valve is normal in structure. There is mild calcification of the mitral valve leaflet(s). Mild mitral annular calcification. No evidence of mitral valve regurgitation. No evidence of mitral valve stenosis. Tricuspid Valve: The tricuspid valve is normal in structure. Tricuspid valve regurgitation is trivial. Aortic Valve: The aortic valve is tricuspid. There is moderate calcification of the aortic valve. Aortic valve regurgitation is trivial. Mild aortic stenosis is present. Aortic valve mean gradient measures 11.0 mmHg. Aortic valve peak gradient measures 19.4 mmHg. Aortic valve area, by VTI measures 1.53 cm. Pulmonic Valve: The pulmonic valve was normal in structure. Pulmonic valve regurgitation is trivial. Aorta: The aortic root is normal in size and structure. Venous: The inferior vena cava is normal in size with greater than 50% respiratory variability, suggesting right atrial pressure of 3 mmHg. IAS/Shunts: No atrial level shunt detected by color flow Doppler.  LEFT VENTRICLE PLAX 2D LVIDd:         3.70 cm   Diastology LVIDs:         3.00 cm   LV e' medial:    3.92 cm/s LV PW:         1.30 cm   LV E/e' medial:  14.8 LV IVS:        1.30 cm   LV e' lateral:   2.72  cm/s LVOT diam:     1.90 cm   LV E/e' lateral: 21.4 LV SV:         56 LV SV Index:   30 LVOT Area:     2.84 cm  RIGHT VENTRICLE RV S prime:     8.59 cm/s TAPSE (M-mode): 2.0 cm LEFT ATRIUM         Index LA diam:    2.70 cm 1.46 cm/m  AORTIC VALVE AV Area (Vmax):    1.17 cm AV Area (Vmean):   1.29 cm AV Area (VTI):     1.53 cm AV Vmax:           220.00 cm/s AV Vmean:          144.400 cm/s AV VTI:            0.366 m AV Peak Grad:      19.4 mmHg AV Mean Grad:      11.0 mmHg LVOT Vmax:         90.50 cm/s LVOT Vmean:        65.800 cm/s LVOT VTI:          0.198 m  LVOT/AV VTI ratio: 0.54  AORTA Ao Root diam: 3.20 cm MITRAL VALVE                TRICUSPID VALVE MV Area (PHT): 2.54 cm     TR Peak grad:   28.3 mmHg MV Decel Time: 299 msec     TR Vmax:        266.00 cm/s MV E velocity: 58.10 cm/s MV A velocity: 100.00 cm/s  SHUNTS MV E/A ratio:  0.58         Systemic VTI:  0.20 m                             Systemic Diam: 1.90 cm Dalton McleanMD Electronically signed by Ezra Kanner Signature Date/Time: 09/15/2023/1:11:12 PM    Final    CT Head Wo Contrast Result Date: 09/13/2023 CLINICAL DATA:  Lightheaded with frequent falls. EXAM: CT HEAD WITHOUT CONTRAST TECHNIQUE: Contiguous axial images were obtained from the base of the skull through the vertex without intravenous contrast. RADIATION DOSE REDUCTION: This exam was performed according to the departmental dose-optimization program which includes automated exposure control, adjustment of the mA and/or kV according to patient size and/or use of iterative reconstruction technique. COMPARISON:  Jul 18, 2021 FINDINGS: Brain: There is generalized cerebral atrophy with widening of the extra-axial spaces and ventricular dilatation. There are areas of decreased attenuation within the white matter tracts of the supratentorial brain, consistent with microvascular disease changes. Vascular: No hyperdense vessel or unexpected calcification. Skull: Normal. Negative for fracture or focal lesion. Sinuses/Orbits: There is mild anterior left ethmoid sinus mucosal thickening. Postoperative changes consistent with prior right mastoidectomy are noted. Other: None. IMPRESSION: 1. 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 2. No acute intracranial abnormality. 3. Evidence of prior right mastoidectomy. Electronically Signed   By: Suzen Dials M.D.   On: 09/13/2023 18:35   DG Chest Port 1 View Result Date: 09/13/2023 CLINICAL DATA:  Shortness of breath EXAM: PORTABLE CHEST 1 VIEW COMPARISON:   08/30/2023 FINDINGS: No acute airspace disease or effusion. Stable cardiomediastinal silhouette with aortic atherosclerosis. No pneumothorax. Probable calcified granuloma in the left lower lung. IMPRESSION: No active disease. Electronically Signed   By: Luke Bun M.D.   On: 09/13/2023 15:53  Nydia Distance M.D. [facility] 09/15/2023, 1:38 PM  Available via Epic secure chat 7am-7pm After 7 pm, please refer to night coverage provider listed on amion.

## 2023-09-15 NOTE — Plan of Care (Signed)
  Problem: Health Behavior/Discharge Planning: Goal: Ability to manage health-related needs will improve Outcome: Progressing   Problem: Activity: Goal: Risk for activity intolerance will decrease Outcome: Progressing   Problem: Nutrition: Goal: Adequate nutrition will be maintained Outcome: Progressing   Problem: Coping: Goal: Level of anxiety will decrease Outcome: Progressing   Problem: Elimination: Goal: Will not experience complications related to bowel motility Outcome: Progressing   Problem: Pain Managment: Goal: General experience of comfort will improve and/or be controlled Outcome: Progressing   Problem: Skin Integrity: Goal: Risk for impaired skin integrity will decrease Outcome: Progressing

## 2023-09-15 NOTE — Evaluation (Signed)
 Occupational Therapy Evaluation/Discharge Patient Details Name: Bob Long MRN: [id number] DOB: 1940/03/20 Today's Date: 09/15/2023   History of Present Illness   83 y.o. male  presents from home on 09/13/23 after multiple falls, dizziness.Found to be hypotensive ,abnormal electrolytes including hypokalemia, hypomagnesemia, hyponatremia. PMH: Parkinson's, hypertension, hyperlipidemia, reports  vertigo     Clinical Impressions Patient evaluated by Occupational Therapy with no further acute OT needs identified. All education has been completed and the patient has no further questions. Pt reports that at baseline he lives at ILF and receives total A for all BADL tasks. Pt is able to complete self feeding at baseline. Pt declined out of bed mobility/transfer during session. Plan is to discharge tomorrow to SNF section of [facility]. Recommended pt follow up with OT services at SNF to focus on balance and mobility in order to return to ILF with wife. No further acute care needs. OT is signing off. Thank you for this referral.      If plan is discharge home, recommend the following:   A lot of help with walking and/or transfers;Two people to help with bathing/dressing/bathroom     Functional Status Assessment   Patient has had a recent decline in their functional status and demonstrates the ability to make significant improvements in function in a reasonable and predictable amount of time.     Equipment Recommendations   None recommended by OT      Precautions/Restrictions   Precautions Precautions: Fall Recall of Precautions/Restrictions: Impaired Restrictions Weight Bearing Restrictions Per Provider Order: No     Mobility Bed Mobility Overal bed mobility: Needs Assistance Bed Mobility: Supine to Sit, Sit to Supine   Sidelying to sit: Mod assist, HOB elevated, Used rails Supine to sit: Mod assist, Used rails     General bed mobility comments: VC for  technique and sequencing with assist to bring trunk off HOB and BLE back onto bed at end of session. With bed in trendelenburg, pt able to assist with boosting self up in bed by grasp headboard.    Transfers        General transfer comment: Pt declined standing or transferring to recliner. See PT eval for functional performance.      Balance Overall balance assessment: Needs assistance, History of Falls Sitting-balance support: Feet unsupported, No upper extremity supported Sitting balance-Leahy Scale: Fair Sitting balance - Comments: sitting EOB        ADL either performed or assessed with clinical judgement   ADL Overall ADL's : At baseline       General ADL Comments: Pt requires total assist for all BADL tasks which is his baseline.     Vision Baseline Vision/History: 1 Wears glasses Ability to See in Adequate Light: 0 Adequate Patient Visual Report: No change from baseline Vision Assessment?: No apparent visual deficits     Perception Perception: Not tested       Praxis Praxis: Not tested       Pertinent Vitals/Pain Pain Assessment Pain Assessment: Faces Faces Pain Scale: No hurt     Extremity/Trunk Assessment Upper Extremity Assessment Upper Extremity Assessment: Right hand dominant;RUE deficits/detail;LUE deficits/detail RUE Deficits / Details: Limited shoulder A/ROM at baseline. Able to demonstrate shoulder flexion to just under 90 degrees. Full wrist and elbow A/ROM demonstrated. 3-/5 shoulder flexion/abduction, 4-/5 shoulder IR/er, 4/5 bicep flexion/extension. Decreased although functional gross grasp. RUE Coordination: decreased fine motor;decreased gross motor LUE Deficits / Details: Limited shoulder A/ROM at baseline. Able to demonstrate shoulder flexion to just under  90 degrees. Full wrist and elbow A/ROM demonstrated. 3-/5 shoulder flexion/abduction, 4-/5 shoulder IR/er, 4/5 bicep flexion/extension. Decreased although functional gross grasp. LUE  Coordination: decreased gross motor;decreased fine motor   Lower Extremity Assessment Lower Extremity Assessment: Generalized weakness   Cervical / Trunk Assessment Cervical / Trunk Assessment: Kyphotic   Communication Communication Communication: Impaired Factors Affecting Communication: Hearing impaired   Cognition Arousal: Alert Behavior During Therapy: Flat affect Cognition: No apparent impairments     Following commands: Intact       Cueing  General Comments   Cueing Techniques: Verbal cues;Tactile cues;Visual cues              Home Living Family/patient expects to be discharged to:: Other (Comment) (ILF) Living Arrangements: Spouse/significant other Available Help at Discharge: Family;Available 24 hours/day Type of Home: Independent living facility Home Access: Level entry     Home Layout: One level     Bathroom Shower/Tub: Tub/shower unit;Walk-in shower   Bathroom Toilet: Handicapped height     Home Equipment: Rollator (4 wheels);Tub bench;Shower seat;Grab bars - tub/shower;Grab bars - toilet;Hand held shower head;Lift chair   Additional Comments: pt sleeps in recliner/lift chair at home      Prior Functioning/Environment Prior Level of Function : Needs assist       Physical Assist : ADLs (physical)   ADLs (physical): Grooming;Bathing;Dressing;Toileting;IADLs Mobility Comments: independent with rollator ADLs Comments: Pt receives assistance with all BADL tasks. Pt reports that he is able to self feed. Facility completes meal prep.    OT Problem List: Decreased strength;Decreased activity tolerance;Impaired balance (sitting and/or standing)        OT Goals(Current goals can be found in the care plan section)   Acute Rehab OT Goals OT Goal Formulation: All assessment and education complete, DC therapy   OT Frequency:   1X visit       AM-PAC OT 6 Clicks Daily Activity     Outcome Measure Help from another person eating meals?:  None Help from another person taking care of personal grooming?: Total Help from another person toileting, which includes using toliet, bedpan, or urinal?: Total Help from another person bathing (including washing, rinsing, drying)?: Total Help from another person to put on and taking off regular upper body clothing?: Total Help from another person to put on and taking off regular lower body clothing?: Total 6 Click Score: 9   End of Session    Activity Tolerance: Patient limited by fatigue;Patient tolerated treatment well Patient left: in bed;with call bell/phone within reach;with bed alarm set;with family/visitor present  OT Visit Diagnosis: Muscle weakness (generalized) (M62.81);History of falling (Z91.81);Unsteadiness on feet (R26.81)                Time: 8443-8384 OT Time Calculation (min): 19 min Charges:  OT General Charges $OT Visit: 1 Visit OT Evaluation $OT Eval Low Complexity: 1 Low  Leita Howell, OTR/L,CBIS  Supplemental OT - [facility] and [facility] Secure Chat Preferred    Godwin Tedesco, Leita BIRCH 09/15/2023, 4:50 PM

## 2023-09-15 NOTE — Progress Notes (Signed)
  Echocardiogram 2D Echocardiogram has been performed.  Tinnie FORBES Gosling RDCS 09/15/2023, 9:52 AM

## 2023-09-15 NOTE — NC FL2 (Signed)
 [place]  MEDICAID FL2 LEVEL OF CARE FORM     IDENTIFICATION  Patient Name: Bob Long Birthdate: 08/03/1940 Sex: male Admission Date (Current Location): 09/13/2023  [facility] and [place] Number:  Producer, television/film/video and Address:  [facility],  [address], [place] [address]      Provider Number: [id number]  Attending Physician Name and Address:  Davia Nydia POUR, MD  Relative Name and Phone Number:  Elvina Sora (Spouse)  [phone] [facility])    Current Level of Care: Hospital Recommended Level of Care: Skilled Nursing Facility Prior Approval Number:    Date Approved/Denied:   PASRR Number: [id number]  Discharge Plan: SNF    Current Diagnoses: Patient Active Problem List   Diagnosis Date Noted   Electrolyte abnormality 09/13/2023   Hyponatremia 08/02/2023   Parkinson's disease (HCC) 07/15/2023   Anxiety 07/15/2023   HLD (hyperlipidemia) 07/15/2023   History of syncope 07/15/2023   HTN (hypertension) 07/15/2023   Type 2 diabetes mellitus (HCC) 07/15/2023   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    Orientation RESPIRATION BLADDER Height & Weight     Self, Time, Situation, Place  Normal Continent Weight: 159 lb 2.8 oz (72.2 kg) Height:  5' 8 (172.7 cm)  BEHAVIORAL SYMPTOMS/MOOD NEUROLOGICAL BOWEL NUTRITION STATUS      Continent Diet (Regular)  AMBULATORY STATUS COMMUNICATION OF NEEDS Skin   Limited Assist Verbally Normal                       Personal Care Assistance Level of Assistance  Dressing, Bathing, Feeding Bathing Assistance: Limited assistance Feeding assistance: Independent Dressing Assistance: Limited assistance     Functional Limitations Info  Sight, Hearing, Speech Sight Info: Adequate Hearing Info: Impaired Speech Info: Adequate    SPECIAL CARE FACTORS FREQUENCY  PT (By licensed PT), OT (By licensed OT)     PT Frequency: 5x/wk OT Frequency: 5x/wk            Contractures Contractures Info: Present     Additional Factors Info  Code Status, Allergies Code Status Info: Full Code Allergies Info: No known allergies           Current Medications (09/15/2023):  This is the current hospital active medication list Current Facility-Administered Medications  Medication Dose Route Frequency Provider Last Rate Last Admin   acetaminophen (TYLENOL) tablet 650 mg  650 mg Oral Q6H PRN Dorrell, Robert, MD       Or   acetaminophen (TYLENOL) suppository 650 mg  650 mg Rectal Q6H PRN Dorrell, Robert, MD       aspirin EC tablet 81 mg  81 mg Oral Daily Dorrell, Robert, MD   81 mg at 09/14/23 1028   atorvastatin (LIPITOR) tablet 20 mg  20 mg Oral Daily Dorrell, Robert, MD   20 mg at 09/14/23 1028   carbidopa-levodopa (SINEMET IR) 25-100 MG per tablet immediate release 1 tablet  1 tablet Oral BID Dena Charleston, MD   1 tablet at 09/15/23 9162   And   carbidopa-levodopa (SINEMET IR) 25-100 MG per tablet immediate release 2 tablet  2 tablet Oral QPC lunch Dorrell, Robert, MD   2 tablet at 09/14/23 1330   enoxaparin (LOVENOX) injection 40 mg  40 mg Subcutaneous Q24H Dena Charleston, MD   40 mg at 09/14/23 1029   guaiFENesin (ROBITUSSIN) 100 MG/5ML liquid 5 mL  5 mL Oral Q4H PRN Amin, Ankit C, MD       hydrALAZINE (APRESOLINE) injection 10 mg  10 mg Intravenous  Q4H PRN Amin, Ankit C, MD       ipratropium-albuterol (DUONEB) 0.5-2.5 (3) MG/3ML nebulizer solution 3 mL  3 mL Nebulization Q4H PRN Amin, Ankit C, MD       irbesartan (AVAPRO) tablet 150 mg  150 mg Oral Daily Dorrell, Robert, MD   150 mg at 09/14/23 1028   metoprolol tartrate (LOPRESSOR) injection 5 mg  5 mg Intravenous Q4H PRN Amin, Ankit C, MD       ondansetron (ZOFRAN) tablet 4 mg  4 mg Oral Q6H PRN Dorrell, Robert, MD       Or   ondansetron (ZOFRAN) injection 4 mg  4 mg Intravenous Q6H PRN Dorrell, Robert, MD       polyethylene glycol (MIRALAX / GLYCOLAX) packet 17 g  17 g Oral Daily Daniels, James K, NP   17 g at 09/14/23 2016   sodium chloride   tablet 2 g  2 g Oral TID WC Rai, Ripudeep K, MD   2 g at 09/15/23 0836   traZODone (DESYREL) tablet 50 mg  50 mg Oral QHS PRN Dena Charleston, MD   50 mg at 09/14/23 2001     Discharge Medications: Please see discharge summary for a list of discharge medications.  Relevant Imaging Results:  Relevant Lab Results:   Additional Information SSN [id number]  Sheri ONEIDA Sharps, LCSW

## 2023-09-15 NOTE — Plan of Care (Signed)
   Problem: Nutrition: Goal: Adequate nutrition will be maintained Outcome: Progressing   Problem: Safety: Goal: Ability to remain free from injury will improve Outcome: Progressing   Problem: Skin Integrity: Goal: Risk for impaired skin integrity will decrease Outcome: Progressing

## 2023-09-16 DIAGNOSIS — R531 Weakness: Secondary | ICD-10-CM

## 2023-09-16 DIAGNOSIS — E871 Hypo-osmolality and hyponatremia: Principal | ICD-10-CM

## 2023-09-16 DIAGNOSIS — E876 Hypokalemia: Secondary | ICD-10-CM | POA: Diagnosis not present

## 2023-09-16 DIAGNOSIS — G20A1 Parkinson's disease without dyskinesia, without mention of fluctuations: Secondary | ICD-10-CM

## 2023-09-16 DIAGNOSIS — Z87898 Personal history of other specified conditions: Secondary | ICD-10-CM | POA: Diagnosis not present

## 2023-09-16 LAB — BASIC METABOLIC PANEL WITH GFR
Anion gap: 7 (ref 5–15)
BUN: 10 mg/dL (ref 8–23)
CO2: 22 mmol/L (ref 22–32)
Calcium: 8.4 mg/dL — ABNORMAL LOW (ref 8.9–10.3)
Chloride: 99 mmol/L (ref 98–111)
Creatinine, Ser: 0.77 mg/dL (ref 0.61–1.24)
GFR, Estimated: >60 mL/min (ref >60)
Glucose, Bld: 110 mg/dL — ABNORMAL HIGH (ref 70–99)
Potassium: 3.6 mmol/L (ref 3.5–5.1)
Sodium: 128 mmol/L — ABNORMAL LOW (ref 135–145)

## 2023-09-16 LAB — URINALYSIS, ROUTINE W REFLEX MICROSCOPIC
Bilirubin Urine: NEGATIVE
Glucose, UA: NEGATIVE mg/dL
Hgb urine dipstick: NEGATIVE
Ketones, ur: 5 mg/dL — AB
Leukocytes,Ua: NEGATIVE
Nitrite: NEGATIVE
Protein, ur: NEGATIVE mg/dL
Specific Gravity, Urine: 1.018 (ref 1.005–1.030)
pH: 6 (ref 5.0–8.0)

## 2023-09-16 LAB — CBC
HCT: 38.6 % — ABNORMAL LOW (ref 39.0–52.0)
Hemoglobin: 13.6 g/dL (ref 13.0–17.0)
MCH: 33.2 pg (ref 26.0–34.0)
MCHC: 35.2 g/dL (ref 30.0–36.0)
MCV: 94.1 fL (ref 80.0–100.0)
Platelets: 177 10*3/uL (ref 150–400)
RBC: 4.1 MIL/uL — ABNORMAL LOW (ref 4.22–5.81)
RDW: 12.5 % (ref 11.5–15.5)
WBC: 11.4 10*3/uL — ABNORMAL HIGH (ref 4.0–10.5)
nRBC: 0 % (ref 0.0–0.2)

## 2023-09-16 LAB — MAGNESIUM: Magnesium: 1.7 mg/dL (ref 1.7–2.4)

## 2023-09-16 NOTE — Progress Notes (Signed)
 Physical Therapy Treatment Patient Details Name: Bob Long MRN: [id number] DOB: 1941-02-09 Today's Date: 09/16/2023   History of Present Illness 83 y.o. male  presents from home on 09/13/23 after multiple falls, dizziness.Found to be hypotensive ,abnormal electrolytes including hypokalemia, hypomagnesemia, hyponatremia. PMH: Parkinson's, hypertension, hyperlipidemia, reports  vertigo    PT Comments  AxO x 3 pleasant and willing.  Fearful.  Hx falls.  Parkinsons Assisted OOB to amb. General transfer comment: Pt required Max Assist to rise due to typical Parkinson's movement disorder issues of festination, rigidity and weakness.  Initial posterior lean.  Assist with turns present with poor self correction and poor forward flexed posture. General Gait Details: intermittent  festinating short shuffled steps.  Assist with balance and posture.  Tolerated amb 28 feet limited by fatigue. Max Assist back to bed then position to comfort. LPT has rec Pt will need ST Rehab at SNF to address mobility and functional decline prior to safely returning home.    If plan is discharge home, recommend the following: A little help with walking and/or transfers;A little help with bathing/dressing/bathroom;Assistance with cooking/housework;Assist for transportation;Help with stairs or ramp for entrance   Can travel by private vehicle     Yes  Equipment Recommendations  None recommended by PT    Recommendations for Other Services       Precautions / Restrictions Precautions Precautions: Fall Precaution/Restrictions Comments: Parkinsons Restrictions Weight Bearing Restrictions Per Provider Order: No     Mobility  Bed Mobility Overal bed mobility: Needs Assistance Bed Mobility: Supine to Sit, Sit to Supine     Supine to sit: Max assist Sit to supine: Max assist   General bed mobility comments: Rigid throughout, slow moving.  Required Max Assist to transfer to EOB and Max Assist back to bed.     Transfers Overall transfer level: Needs assistance Equipment used: Rolling walker (2 wheels) Transfers: Sit to/from Stand Sit to Stand: Max assist           General transfer comment: Pt required Max Assist to rise due to typical Parkinson's movement disorder issues of festination, rigidity and weakness.  Initial posterior lean.  Assist with turns present with poor self correction and poor forward flexed posture.    Ambulation/Gait Ambulation/Gait assistance: Min assist, Mod assist Gait Distance (Feet): 28 Feet Assistive device: Rolling walker (2 wheels) Gait Pattern/deviations: Step-to pattern, Step-through pattern, Festinating Gait velocity: decreased     General Gait Details: intermittent  festinating short shuffled steps.  Assist with balance and posture.  Tolerated amb 28 feet limited by fatigue.   Stairs             Wheelchair Mobility     Tilt Bed    Modified Rankin (Stroke Patients Only)       Balance                                            Communication Communication Communication: Impaired Factors Affecting Communication: Hearing impaired  Cognition Arousal: Alert Behavior During Therapy: WFL for tasks assessed/performed   PT - Cognitive impairments: No apparent impairments                       PT - Cognition Comments: AxO x 3 pleasant and willing.  Fearful.  Hx falls.  Parkinsons Following commands: Intact      Cueing Cueing Techniques: Verbal cues  Exercises      General Comments        Pertinent Vitals/Pain Pain Assessment Pain Assessment: Faces Faces Pain Scale: Hurts a little bit Pain Location: stiffness Pain Descriptors / Indicators: Tightness Pain Intervention(s): Monitored during session, Repositioned    Home Living                          Prior Function            PT Goals (current goals can now be found in the care plan section) Progress towards PT goals: Progressing  toward goals    Frequency    Min 2X/week      PT Plan      Co-evaluation              AM-PAC PT 6 Clicks Mobility   Outcome Measure  Help needed turning from your back to your side while in a flat bed without using bedrails?: A Lot Help needed moving from lying on your back to sitting on the side of a flat bed without using bedrails?: A Lot Help needed moving to and from a bed to a chair (including a wheelchair)?: A Lot Help needed standing up from a chair using your arms (e.g., wheelchair or bedside chair)?: A Lot Help needed to walk in hospital room?: A Lot Help needed climbing 3-5 steps with a railing? : Total 6 Click Score: 11    End of Session Equipment Utilized During Treatment: Gait belt Activity Tolerance: Patient tolerated treatment well;Patient limited by fatigue Patient left: in bed;with call bell/phone within reach;with bed alarm set Nurse Communication: Mobility status PT Visit Diagnosis: Unsteadiness on feet (R26.81);Muscle weakness (generalized) (M62.81);Difficulty in walking, not elsewhere classified (R26.2);Repeated falls (R29.6)     Time: 8373-8353 PT Time Calculation (min) (ACUTE ONLY): 20 min  Charges:    $Gait Training: 8-22 mins PT General Charges $$ ACUTE PT VISIT: 1 Visit                     Katheryn Leap  PTA Acute  Rehabilitation Services Office M-F          [phone]

## 2023-09-16 NOTE — Progress Notes (Signed)
 [facility]  PROGRESS NOTE  Bob Long FMW:[id number] DOB: 22-Jun-1940 DOA: 09/13/2023 PCP: Mast, Man X, NP   Brief HPI:   83 year old male with Parkinson's, hypertension, hyperlipidemia who presented to emergency department after a fall. Patient got up and felt lightheaded. He has been having an ongoing issues with weakness and lightheadedness. EMS was called he was given some IV fluids. He was found to be hypotensive and transported to the ER. In the ER noted to have multiple abnormal electrolytes including hypokalemia, hypomagnesemia, hyponatremia. CT head and chest x-ray were negative.       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 - multifactorial, has been having ongoing issues with weakness and lightheadedness, sodium was 128, subsequently placed on IV fluids and trended down to 124 on 7/1 - Serum osmolarity 267, urine osmolarity 413, urine sodium 104, points towards SIADH pattern - DC IV fluids, placed on salt tabs, increase to 2 g 3 times daily, sodium improving -Told her sodium was up to 128     Active Problems: Hypokalemia, hypomagnesemia -Resolved   Hypocalcemia - Improved       Parkinson's disease (HCC) -Continue Sinemet       HLD (hyperlipidemia) -Continue Lipitor     HTN (hypertension) -Continue Avapro     Type 2 diabetes mellitus (HCC) - Not on any medications, hemoglobin A1c 6.7 on 07/27/2023        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   Weight as of this encounter: 72.2 kg.    Medications     aspirin EC  81 mg Oral Daily   atorvastatin  20 mg Oral Daily   carbidopa-levodopa  1 tablet Oral BID   And   carbidopa-levodopa  2 tablet Oral QPC lunch   enoxaparin (LOVENOX) injection  40 mg Subcutaneous  Q24H   irbesartan  150 mg Oral Daily   polyethylene glycol  17 g Oral Daily   sodium chloride   2 g Oral TID WC     Data Reviewed:   CBG:  No results for input(s): GLUCAP in the last 168 hours.  SpO2: 100 %    Vitals:   09/16/23 1259 09/16/23 1700 09/16/23 1702 09/16/23 1704  BP: (!) 88/56 (!) 159/93 (!) 146/91 131/83  Pulse: 93 69 74 85  Resp:  18    Temp:      TempSrc:      SpO2: 96% 100% 100% 100%  Weight:      Height:          Data Reviewed:  Basic Metabolic Panel: Recent Labs  Lab 09/13/23 1538 09/14/23 0547 09/14/23 1224 09/15/23 0550 09/16/23 0525  NA 128* 126* 124* 128* 128*  K 2.9* 4.6 4.9 4.3 3.6  CL 104 97* 94* 98 99  CO2 19* 19* 20* 22 22  GLUCOSE 135* 102* 207* 112* 110*  BUN 11 11 11 12 10   CREATININE 0.85 0.85 1.04 0.86 0.77  CALCIUM 6.3* 9.0 8.7* 8.3* 8.4*  MG 1.5*  --   --  1.8 1.7  PHOS  --   --   --  2.9  --     CBC: Recent Labs  Lab 09/13/23 1538 09/14/23 0547 09/15/23 0550 09/16/23 0525  WBC 12.1* 9.6 11.1*  11.4*  HGB 9.7* 14.0 13.9 13.6  HCT 27.3* 40.0 39.9 38.6*  MCV 95.5 94.3 94.8 94.1  PLT 136* 198 186 177    LFT Recent Labs  Lab 09/13/23 1538 09/14/23 0547  AST  --  19  ALT  --  7  ALKPHOS  --  68  BILITOT  --  1.6*  PROT  --  5.7*  ALBUMIN 2.6* 3.2*     Antibiotics: Anti-infectives (From admission, onward)    None        DVT prophylaxis: Lovenox  Code Status: Full code  Family Communication: Discussed with patient's son and patient's wife at bedside   CONSULTS none   Subjective   Denies any complaints   Objective    Physical Examination:   General-appears in no acute distress Heart-S1-S2, regular, no murmur auscultated Lungs-clear to auscultation bilaterally, no wheezing or crackles auscultated Abdomen-soft, nontender, no organomegaly Extremities-no edema in the lower extremities Neuro-alert, oriented x3, no focal deficit noted  Status is: Inpatient:              Bob Long   [facility] If 7PM-7AM, please contact night-coverage at [email or web address], Office  [phone]   09/16/2023, 6:09 PM  LOS: 2 days

## 2023-09-17 DIAGNOSIS — E871 Hypo-osmolality and hyponatremia: Secondary | ICD-10-CM | POA: Diagnosis not present

## 2023-09-17 DIAGNOSIS — G20A1 Parkinson's disease without dyskinesia, without mention of fluctuations: Secondary | ICD-10-CM | POA: Diagnosis not present

## 2023-09-17 DIAGNOSIS — E785 Hyperlipidemia, unspecified: Secondary | ICD-10-CM

## 2023-09-17 DIAGNOSIS — I10 Essential (primary) hypertension: Secondary | ICD-10-CM

## 2023-09-17 LAB — BASIC METABOLIC PANEL WITH GFR
Anion gap: 11 (ref 5–15)
BUN: 13 mg/dL (ref 8–23)
CO2: 23 mmol/L (ref 22–32)
Calcium: 8.5 mg/dL — ABNORMAL LOW (ref 8.9–10.3)
Chloride: 96 mmol/L — ABNORMAL LOW (ref 98–111)
Creatinine, Ser: 0.88 mg/dL (ref 0.61–1.24)
GFR, Estimated: >60 mL/min (ref >60)
Glucose, Bld: 105 mg/dL — ABNORMAL HIGH (ref 70–99)
Potassium: 3.6 mmol/L (ref 3.5–5.1)
Sodium: 130 mmol/L — ABNORMAL LOW (ref 135–145)

## 2023-09-17 LAB — CBC
HCT: 38.9 % — ABNORMAL LOW (ref 39.0–52.0)
Hemoglobin: 13.9 g/dL (ref 13.0–17.0)
MCH: 33.4 pg (ref 26.0–34.0)
MCHC: 35.7 g/dL (ref 30.0–36.0)
MCV: 93.5 fL (ref 80.0–100.0)
Platelets: 184 K/uL (ref 150–400)
RBC: 4.16 MIL/uL — ABNORMAL LOW (ref 4.22–5.81)
RDW: 12.6 % (ref 11.5–15.5)
WBC: 9.7 K/uL (ref 4.0–10.5)
nRBC: 0 % (ref 0.0–0.2)

## 2023-09-17 LAB — MAGNESIUM: Magnesium: 1.8 mg/dL (ref 1.7–2.4)

## 2023-09-17 MED ORDER — AMLODIPINE BESYLATE 5 MG PO TABS: 5.0000 mg | ORAL_TABLET | Freq: Every day | ORAL | Status: DC

## 2023-09-17 NOTE — Plan of Care (Signed)
 Discharging to [facility], SNF portion.   Problem: Education: Goal: Knowledge of General Education information will improve Description: Including pain rating scale, medication(s)/side effects and non-pharmacologic comfort measures Outcome: Adequate for Discharge   Problem: Health Behavior/Discharge Planning: Goal: Ability to manage health-related needs will improve Outcome: Adequate for Discharge   Problem: Clinical Measurements: Goal: Ability to maintain clinical measurements within normal limits will improve Outcome: Adequate for Discharge Goal: Will remain free from infection Outcome: Adequate for Discharge Goal: Diagnostic test results will improve Outcome: Adequate for Discharge Goal: Respiratory complications will improve Outcome: Adequate for Discharge Goal: Cardiovascular complication will be avoided Outcome: Adequate for Discharge   Problem: Activity: Goal: Risk for activity intolerance will decrease Outcome: Adequate for Discharge   Problem: Nutrition: Goal: Adequate nutrition will be maintained Outcome: Adequate for Discharge   Problem: Coping: Goal: Level of anxiety will decrease Outcome: Adequate for Discharge   Problem: Elimination: Goal: Will not experience complications related to bowel motility Outcome: Adequate for Discharge Goal: Will not experience complications related to urinary retention Outcome: Adequate for Discharge   Problem: Pain Managment: Goal: General experience of comfort will improve and/or be controlled Outcome: Adequate for Discharge   Problem: Safety: Goal: Ability to remain free from injury will improve Outcome: Adequate for Discharge   Problem: Skin Integrity: Goal: Risk for impaired skin integrity will decrease Outcome: Adequate for Discharge   Problem: Acute Rehab PT Goals(only PT should resolve) Goal: Pt will Roll Supine to Side Outcome: Adequate for Discharge Goal: Pt Will Go Supine/Side To Sit Outcome: Adequate for  Discharge Goal: Pt Will Go Sit To Supine/Side Outcome: Adequate for Discharge Goal: Patient Will Transfer Sit To/From Stand Outcome: Adequate for Discharge Goal: Pt Will Ambulate Outcome: Adequate for Discharge

## 2023-09-17 NOTE — Progress Notes (Signed)
 Nursing Discharge Note  Name: Bob Long MRN: [id number] DOB: Sep 27, 1940  Admit Date: 09/13/2023 Discharge Date: 09/17/2023  Debby Newcomer to be discharged to a Skilled Nursing Facility per MD order.  AVS completed, placed in discharge packet for facility review. Discharge packet compiled for facility.  Family is providing transport. AVS sent in packet for [facility]. Additional copy of AVS provided highlighted for family. Report called to [facility] LPN at [facility] at [phone]  Discharge Instructions   None      Allergies as of 09/17/2023   No Known Allergies      Medication List     STOP taking these medications    valsartan 160 MG tablet Commonly known as: DIOVAN       TAKE these medications    amLODipine  5 MG tablet Commonly known as: NORVASC  Take 1 tablet (5 mg total) by mouth daily.   aspirin  EC 81 MG tablet Take 81 mg by mouth daily.   atorvastatin  20 MG tablet Commonly known as: LIPITOR Take 20 mg by mouth daily.   carbidopa -levodopa  25-100 MG tablet Commonly known as: SINEMET  IR Take 1-2 tablets by mouth See admin instructions. Take 1 tablet by mouth in the morning & at bedtime and 2 tablets at noontime   CENTRUM MINIS MEN 50+ PO Take 1 tablet by mouth daily after supper.   polyethylene glycol 17 g packet Commonly known as: MIRALAX  / GLYCOLAX  Take 17 g by mouth daily as needed for mild constipation.         Discharge Instructions     Diet - low sodium heart healthy   Complete by: As directed    Increase activity slowly   Complete by: As directed          Discharge Instructions/ Education: An After Visit Summary was printed and given to the patient. Discharge instructions given to patient/family with verbalized understanding. Discharge education completed with patient/family including: follow up instructions, medication list, discharge activities, and limitations if indicated.  Additional discharge instructions as indicated by  discharging provider also reviewed.  Patient and family able to verbalize understanding, all questions fully answered. Patient instructed to return to Emergency Department, call 911, or call MD for any changes in condition.  Patient discharged from hospital unit via wheelchair. Stable at time of discharge. Family is to provide transportation to SNF.

## 2023-09-17 NOTE — TOC Transition Note (Addendum)
 Transition of Care [facility]) - Discharge Note   Patient Details  Name: Bob Long MRN: [id number] Date of Birth: 1940/03/23  Transition of Care [facility]) CM/SW Contact:  Toy LITTIE Agar, RN Phone Number:[phone]  09/17/2023, 1:10 PM   Clinical Narrative:    Lonell Pereyra has confirmed that patient is able to discharge to [facility]. Nurse inquiring about transportation. CM has informed nurse that handoff states that patients family is going to transport to facility. Per nurse family is at bedside and states that they were told by MD that [facility] or facility can transport. Patient currently does not meet criteria for medical necessity for  [facility] transport per therapy documentation. CM has reached out to [facility] to inquire about transport. Awaiting follow up.   1400 Transportation has been arranged per [facility] transportation.   1440 CM has received message from nurse stating that family will transport patient. CM has notified [facility] to cancel transport. TOC signing off      Barriers to Discharge: No Barriers Identified   Patient Goals and CMS Choice Patient states their goals for this hospitalization and ongoing recovery are:: return to [facility] for SNF CMS Medicare.gov Compare Post Acute Care list provided to:: Patient Choice offered to / list presented to : Patient  ownership interest in [facility].provided to:: Patient    Discharge Placement                       Discharge Plan and Services Additional resources added to the After Visit Summary for   In-house Referral: NA Discharge Planning Services: NA Post Acute Care Choice: Skilled Nursing Facility          DME Arranged: N/A DME Agency: NA       HH Arranged: NA HH Agency: NA        Social Drivers of Health (SDOH) Interventions SDOH Screenings   Food Insecurity: No Food Insecurity (09/13/2023)  Housing: Low Risk  (09/13/2023)  Transportation Needs: No  Transportation Needs (09/13/2023)  Utilities: Not At Risk (09/13/2023)  Depression (PHQ2-9): Low Risk  (07/15/2023)  Financial Resource Strain: Low Risk  (07/11/2021)   Received from [facility], [facility] visits prior to 05/16/2022.  Physical Activity: Unknown (07/11/2021)   Received from [facility], [facility] visits prior to 05/16/2022.  Social Connections: Socially Isolated (09/13/2023)  Stress: No Stress Concern Present (07/11/2021)   Received from [facility], [facility] visits prior to 05/16/2022.  Tobacco Use: Medium Risk (09/13/2023)     Readmission Risk Interventions    09/14/2023    4:17 PM  Readmission Risk Prevention Plan  Post Dischage Appt Complete  Medication Screening Complete  Transportation Screening Complete

## 2023-09-17 NOTE — Discharge Summary (Signed)
 Physician Discharge Summary   Patient: Bob Long MRN: [id number] DOB: 1941-03-14  Admit date:     09/13/2023  Discharge date: 09/17/23  Discharge Physician: Sabas GORMAN Brod   PCP: Mast, Man X, NP   Recommendations at discharge:   Check orthostatic vital signs at facility Started on amlodipine  5 mg daily, may need adjustment of blood pressure medications Started on TED hose for orthostatic hypotension Check BMP in 1 week  Discharge Diagnoses: Principal Problem:   Hyponatremia Active Problems:   Parkinson's disease (HCC)   HLD (hyperlipidemia)   HTN (hypertension)   Type 2 diabetes mellitus (HCC)   Slow transit constipation  Resolved Problems:   * No resolved hospital problems. *  Hospital Course:  83 year old male with Parkinson's, hypertension, hyperlipidemia who presented to emergency department after a fall. Patient got up and felt lightheaded. He has been having an ongoing issues with weakness and lightheadedness. EMS was called he was given some IV fluids. He was found to be hypotensive and transported to the ER. In the ER noted to have multiple abnormal electrolytes including hypokalemia, hypomagnesemia, hyponatremia. CT head and chest x-ray were negative.     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 -Sodium improved with salt tablets and water restriction -Sodium 130, back to baseline - Will discontinue valsartan    Active Problems: Hypokalemia, hypomagnesemia -Resolved   Hypocalcemia - Improved       Parkinson's disease (HCC) -Continue Sinemet        HLD (hyperlipidemia) -Continue Lipitor     HTN (hypertension) - Discontinue ARB, will add amlodipine  5 mg daily     Type 2 diabetes mellitus (HCC) - Not on any medications, hemoglobin A1c 6.7 on 07/27/2023    Slow transit constipation - Improved with MiraLAX  and lactulose    Near syncope, fatigue, weakness 2D echo showed EF of 65 to 70%, G1 DD, no WMA        Consultants:  Procedures  performed:  Disposition: Skilled nursing facility Diet recommendation:  Discharge Diet Orders (From admission, onward)     Start     Ordered   09/17/23 0000  Diet - low sodium heart healthy        09/17/23 1217           Regular diet DISCHARGE MEDICATION: Allergies as of 09/17/2023   No Known Allergies      Medication List     STOP taking these medications    valsartan 160 MG tablet Commonly known as: DIOVAN       TAKE these medications    amLODipine  5 MG tablet Commonly known as: NORVASC  Take 1 tablet (5 mg total) by mouth daily.   aspirin  EC 81 MG tablet Take 81 mg by mouth daily.   atorvastatin  20 MG tablet Commonly known as: LIPITOR Take 20 mg by mouth daily.   carbidopa -levodopa  25-100 MG tablet Commonly known as: SINEMET  IR Take 1-2 tablets by mouth See admin instructions. Take 1 tablet by mouth in the morning & at bedtime and 2 tablets at noontime   CENTRUM MINIS MEN 50+ PO Take 1 tablet by mouth daily after supper.   polyethylene glycol 17 g packet Commonly known as: MIRALAX  / GLYCOLAX  Take 17 g by mouth daily as needed for mild constipation.        Discharge Exam: Filed Weights   09/13/23 2319  Weight: 72.2 kg   General-appears in no acute distress Heart-S1-S2, regular, no murmur auscultated Lungs-clear to auscultation bilaterally, no wheezing or crackles auscultated  Abdomen-soft, nontender, no organomegaly Extremities-no edema in the lower extremities Neuro-alert, oriented x3, no focal deficit noted  Condition at discharge: good  The results of significant diagnostics from this hospitalization (including imaging, microbiology, ancillary and laboratory) are listed below for reference.   Imaging Studies: ECHOCARDIOGRAM COMPLETE Result Date: 09/15/2023    ECHOCARDIOGRAM REPORT   Patient Name:   TAHA DIMOND Date of Exam: 09/15/2023 Medical Rec #:  [id number]       Height:       68.0 in Accession #:    [id number]      Weight:        159.2 lb Date of Birth:  12-01-1940        BSA:          1.855 m Patient Age:    83 years        BP:           148/82 mmHg Patient Gender: M               HR:           79 bpm. Exam Location:  Inpatient Procedure: 2D Echo, Color Doppler and Cardiac Doppler (Both Spectral and Color            Flow Doppler were utilized during procedure). Indications:    Cardiomyopathy Unspecified I42.9  History:        Patient has no prior history of Echocardiogram examinations.                 Risk Factors:Diabetes and Hypertension.  Sonographer:    Tinnie Gosling RDCS Referring Phys: [id number] BURGESS BROCKS AMIN IMPRESSIONS  1. Left ventricular ejection fraction, by estimation, is 65 to 70%. The left ventricle has normal function. The left ventricle has no regional wall motion abnormalities. There is mild concentric left ventricular hypertrophy. Left ventricular diastolic parameters are consistent with Grade I diastolic dysfunction (impaired relaxation).  2. Right ventricular systolic function is mildly reduced. The right ventricular size is mildly enlarged. There is normal pulmonary artery systolic pressure. The estimated right ventricular systolic pressure is 31.3 mmHg.  3. The mitral valve is normal in structure. No evidence of mitral valve regurgitation. No evidence of mitral stenosis.  4. The aortic valve is tricuspid. There is moderate calcification of the aortic valve. Aortic valve regurgitation is trivial. Mild aortic valve stenosis. Aortic valve area, by VTI measures 1.53 cm. Aortic valve mean gradient measures 11.0 mmHg.  5. The inferior vena cava is normal in size with greater than 50% respiratory variability, suggesting right atrial pressure of 3 mmHg. FINDINGS  Left Ventricle: Left ventricular ejection fraction, by estimation, is 65 to 70%. The left ventricle has normal function. The left ventricle has no regional wall motion abnormalities. The left ventricular internal cavity size was normal in size. There is  mild concentric  left ventricular hypertrophy. Left ventricular diastolic parameters are consistent with Grade I diastolic dysfunction (impaired relaxation). Right Ventricle: The right ventricular size is mildly enlarged. No increase in right ventricular wall thickness. Right ventricular systolic function is mildly reduced. There is normal pulmonary artery systolic pressure. The tricuspid regurgitant velocity  is 2.66 m/s, and with an assumed right atrial pressure of 3 mmHg, the estimated right ventricular systolic pressure is 31.3 mmHg. Left Atrium: Left atrial size was normal in size. Right Atrium: Right atrial size was normal in size. Pericardium: There is no evidence of pericardial effusion. Mitral Valve: The mitral valve is normal in structure. There is mild calcification  of the mitral valve leaflet(s). Mild mitral annular calcification. No evidence of mitral valve regurgitation. No evidence of mitral valve stenosis. Tricuspid Valve: The tricuspid valve is normal in structure. Tricuspid valve regurgitation is trivial. Aortic Valve: The aortic valve is tricuspid. There is moderate calcification of the aortic valve. Aortic valve regurgitation is trivial. Mild aortic stenosis is present. Aortic valve mean gradient measures 11.0 mmHg. Aortic valve peak gradient measures 19.4 mmHg. Aortic valve area, by VTI measures 1.53 cm. Pulmonic Valve: The pulmonic valve was normal in structure. Pulmonic valve regurgitation is trivial. Aorta: The aortic root is normal in size and structure. Venous: The inferior vena cava is normal in size with greater than 50% respiratory variability, suggesting right atrial pressure of 3 mmHg. IAS/Shunts: No atrial level shunt detected by color flow Doppler.  LEFT VENTRICLE PLAX 2D LVIDd:         3.70 cm   Diastology LVIDs:         3.00 cm   LV e' medial:    3.92 cm/s LV PW:         1.30 cm   LV E/e' medial:  14.8 LV IVS:        1.30 cm   LV e' lateral:   2.72 cm/s LVOT diam:     1.90 cm   LV E/e' lateral:  21.4 LV SV:         56 LV SV Index:   30 LVOT Area:     2.84 cm  RIGHT VENTRICLE RV S prime:     8.59 cm/s TAPSE (M-mode): 2.0 cm LEFT ATRIUM         Index LA diam:    2.70 cm 1.46 cm/m  AORTIC VALVE AV Area (Vmax):    1.17 cm AV Area (Vmean):   1.29 cm AV Area (VTI):     1.53 cm AV Vmax:           220.00 cm/s AV Vmean:          144.400 cm/s AV VTI:            0.366 m AV Peak Grad:      19.4 mmHg AV Mean Grad:      11.0 mmHg LVOT Vmax:         90.50 cm/s LVOT Vmean:        65.800 cm/s LVOT VTI:          0.198 m LVOT/AV VTI ratio: 0.54  AORTA Ao Root diam: 3.20 cm MITRAL VALVE                TRICUSPID VALVE MV Area (PHT): 2.54 cm     TR Peak grad:   28.3 mmHg MV Decel Time: 299 msec     TR Vmax:        266.00 cm/s MV E velocity: 58.10 cm/s MV A velocity: 100.00 cm/s  SHUNTS MV E/A ratio:  0.58         Systemic VTI:  0.20 m                             Systemic Diam: 1.90 cm Dalton McleanMD Electronically signed by Ezra Kanner Signature Date/Time: 09/15/2023/1:11:12 PM    Final    CT Head Wo Contrast Result Date: 09/13/2023 CLINICAL DATA:  Lightheaded with frequent falls. EXAM: CT HEAD WITHOUT CONTRAST TECHNIQUE: Contiguous axial images were obtained from the base of the skull through the vertex without intravenous  contrast. RADIATION DOSE REDUCTION: This exam was performed according to the departmental dose-optimization program which includes automated exposure control, adjustment of the mA and/or kV according to patient size and/or use of iterative reconstruction technique. COMPARISON:  Jul 18, 2021 FINDINGS: Brain: There is generalized cerebral atrophy with widening of the extra-axial spaces and ventricular dilatation. There are areas of decreased attenuation within the white matter tracts of the supratentorial brain, consistent with microvascular disease changes. Vascular: No hyperdense vessel or unexpected calcification. Skull: Normal. Negative for fracture or focal lesion. Sinuses/Orbits: There is mild  anterior left ethmoid sinus mucosal thickening. Postoperative changes consistent with prior right mastoidectomy are noted. Other: None. IMPRESSION: 1. 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 2. No acute intracranial abnormality. 3. Evidence of prior right mastoidectomy. Electronically Signed   By: Suzen Dials M.D.   On: 09/13/2023 18:35   DG Chest Port 1 View Result Date: 09/13/2023 CLINICAL DATA:  Shortness of breath EXAM: PORTABLE CHEST 1 VIEW COMPARISON:  08/30/2023 FINDINGS: No acute airspace disease or effusion. Stable cardiomediastinal silhouette with aortic atherosclerosis. No pneumothorax. Probable calcified granuloma in the left lower lung. IMPRESSION: No active disease. Electronically Signed   By: Luke Bun M.D.   On: 09/13/2023 15:53   DG Chest Port 1 View Result Date: 08/30/2023 CLINICAL DATA:  Syncope EXAM: PORTABLE CHEST 1 VIEW COMPARISON:  X-ray 07/18/2021 FINDINGS: Hyperinflation. No consolidation, pneumothorax or effusion. No edema. Normal cardiopericardial silhouette. Calcified aorta. Overlapping cardiac leads. Kyphotic x-ray obscures the left lung apex. IMPRESSION: No acute cardiopulmonary disease. Hyperinflation with chronic changes. Kyphotic x-ray obscures the left lung apex. Electronically Signed   By: Ranell Bring M.D.   On: 08/30/2023 12:38    Microbiology: No results found for this or any previous visit.  Labs: CBC: Recent Labs  Lab 09/13/23 1538 09/14/23 0547 09/15/23 0550 09/16/23 0525 09/17/23 0533  WBC 12.1* 9.6 11.1* 11.4* 9.7  HGB 9.7* 14.0 13.9 13.6 13.9  HCT 27.3* 40.0 39.9 38.6* 38.9*  MCV 95.5 94.3 94.8 94.1 93.5  PLT 136* 198 186 177 184   Basic Metabolic Panel: Recent Labs  Lab 09/13/23 1538 09/14/23 0547 09/14/23 1224 09/15/23 0550 09/16/23 0525 09/17/23 0533  NA 128* 126* 124* 128* 128* 130*  K 2.9* 4.6 4.9 4.3 3.6 3.6  CL 104 97* 94* 98 99 96*  CO2 19* 19* 20* 22 22 23   GLUCOSE 135*  102* 207* 112* 110* 105*  BUN 11 11 11 12 10 13   CREATININE 0.85 0.85 1.04 0.86 0.77 0.88  CALCIUM  6.3* 9.0 8.7* 8.3* 8.4* 8.5*  MG 1.5*  --   --  1.8 1.7 1.8  PHOS  --   --   --  2.9  --   --    Liver Function Tests: Recent Labs  Lab 09/13/23 1538 09/14/23 0547  AST  --  19  ALT  --  7  ALKPHOS  --  68  BILITOT  --  1.6*  PROT  --  5.7*  ALBUMIN 2.6* 3.2*   CBG: No results for input(s): GLUCAP in the last 168 hours.  Discharge time spent: greater than 30 minutes.  Signed: Sabas GORMAN Brod, MD [facility] 09/17/2023

## 2023-09-17 NOTE — Plan of Care (Signed)

## 2023-09-17 NOTE — Progress Notes (Incomplete)
 [facility]  PROGRESS NOTE  Bob Long FMW:[id number] DOB: 1940-08-30 DOA: 09/13/2023 PCP: Mast, Man X, NP   Brief HPI:   83 year old male with Parkinson's, hypertension, hyperlipidemia who presented to emergency department after a fall. Patient got up and felt lightheaded. He has been having an ongoing issues with weakness and lightheadedness. EMS was called he was given some IV fluids. He was found to be hypotensive and transported to the ER. In the ER noted to have multiple abnormal electrolytes including hypokalemia, hypomagnesemia, hyponatremia. CT head and chest x-ray were negative.       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 - multifactorial, has been having ongoing issues with weakness and lightheadedness, sodium was 128, subsequently placed on IV fluids and trended down to 124 on 7/1 - Serum osmolarity 267, urine osmolarity 413, urine sodium 104, points towards SIADH pattern - DC IV fluids, placed on salt tabs, increase to 2 g 3 times daily, sodium improving -Told her sodium was up to 128     Active Problems: Hypokalemia, hypomagnesemia -Resolved   Hypocalcemia - Improved       Parkinson's disease (HCC) -Continue Sinemet        HLD (hyperlipidemia) -Continue Lipitor     HTN (hypertension) -Continue Avapro      Type 2 diabetes mellitus (HCC) - Not on any medications, hemoglobin A1c 6.7 on 07/27/2023        Slow transit constipation - Improved with MiraLAX  and lactulose    Near syncope, fatigue, weakness 2D echo showed EF of 65 to 70%, G1 DD, no WMA   Estimated body mass index is 24.2 kg/m as calculated from the following:   Height as of this encounter: 5' 8 (1.727 m).   Weight as of this encounter: 72.2 kg.    Medications     aspirin  EC  81 mg Oral Daily   atorvastatin   20 mg Oral Daily   carbidopa -levodopa   1 tablet Oral BID   And   carbidopa -levodopa   2 tablet Oral QPC lunch   enoxaparin  (LOVENOX ) injection  40 mg Subcutaneous  Q24H   irbesartan   150 mg Oral Daily   polyethylene glycol  17 g Oral Daily   sodium chloride   2 g Oral TID WC     Data Reviewed:   CBG:  No results for input(s): GLUCAP in the last 168 hours.  SpO2: 96 %    Vitals:   09/16/23 1702 09/16/23 1704 09/16/23 1915 09/17/23 0525  BP: (!) 146/91 131/83 (!) 175/89 (!) 150/89  Pulse: 74 85 75 75  Resp:   20 (!) 8  Temp:   (!) 97.4 F (36.3 C) 97.9 F (36.6 C)  TempSrc:      SpO2: 100% 100% 95% 96%  Weight:      Height:          Data Reviewed:  Basic Metabolic Panel: Recent Labs  Lab 09/13/23 1538 09/14/23 0547 09/14/23 1224 09/15/23 0550 09/16/23 0525 09/17/23 0533  NA 128* 126* 124* 128* 128* 130*  K 2.9* 4.6 4.9 4.3 3.6 3.6  CL 104 97* 94* 98 99 96*  CO2 19* 19* 20* 22 22 23   GLUCOSE 135* 102* 207* 112* 110* 105*  BUN 11 11 11 12 10 13   CREATININE 0.85 0.85 1.04 0.86 0.77 0.88  CALCIUM  6.3* 9.0 8.7* 8.3* 8.4* 8.5*  MG 1.5*  --   --  1.8 1.7 1.8  PHOS  --   --   --  2.9  --   --  CBC: Recent Labs  Lab 09/13/23 1538 09/14/23 0547 09/15/23 0550 09/16/23 0525 09/17/23 0533  WBC 12.1* 9.6 11.1* 11.4* 9.7  HGB 9.7* 14.0 13.9 13.6 13.9  HCT 27.3* 40.0 39.9 38.6* 38.9*  MCV 95.5 94.3 94.8 94.1 93.5  PLT 136* 198 186 177 184    LFT Recent Labs  Lab 09/13/23 1538 09/14/23 0547  AST  --  19  ALT  --  7  ALKPHOS  --  68  BILITOT  --  1.6*  PROT  --  5.7*  ALBUMIN 2.6* 3.2*     Antibiotics: Anti-infectives (From admission, onward)    None        DVT prophylaxis: Lovenox   Code Status: Full code  Family Communication: Discussed with patient's son and patient's wife at bedside   CONSULTS none   Subjective      Objective    Physical Examination:     Status is: Inpatient:             Sabas GORMAN Brod   [facility] If 7PM-7AM, please contact night-coverage at [email or web address], Office  [phone]   09/17/2023, 8:40 AM  LOS: 3 days

## 2023-09-17 NOTE — TOC Progression Note (Addendum)
 Transition of Care [facility]) - Progression Note    Patient Details  Name: Bob Long MRN: [id number] Date of Birth: [birth year]  [facility]) CM/SW Contact  Toy LITTIE Agar, RN Phone Number:[phone]  09/17/2023, 10:31 AM  Clinical Narrative:    CM received message from Lonell Pereyra with [facility] stating that patient is able to return today. He will got to [facility] Rm #[phone] Please call report to [phone].CM will need to update facility once d/c summary is ready. TOC following . Per [facility] handoff family to transport.    Expected Discharge Plan: Skilled Nursing Facility Barriers to Discharge: No Barriers Identified  Expected Discharge Plan and Services In-house Referral: NA Discharge Planning Services: NA Post Acute Care Choice: Skilled Nursing Facility Living arrangements for the past 2 months: Independent Living Facility                 DME Arranged: N/A DME Agency: NA       HH Arranged: NA HH Agency: NA         Social Determinants of Health (SDOH) Interventions SDOH Screenings   Food Insecurity: No Food Insecurity (09/13/2023)  Housing: Low Risk  (09/13/2023)  Transportation Needs: No Transportation Needs (09/13/2023)  Utilities: Not At Risk (09/13/2023)  Depression (PHQ2-9): Low Risk  (07/15/2023)  Financial Resource Strain: Low Risk  (07/11/2021)   Received from [facility], [facility] visits prior to 05/16/2022.  Physical Activity: Unknown (07/11/2021)   Received from [facility], [facility] visits prior to 05/16/2022.  Social Connections: Socially Isolated (09/13/2023)  Stress: No Stress Concern Present (07/11/2021)   Received from [facility], [facility] visits prior to 05/16/2022.  Tobacco Use: Medium Risk (09/13/2023)    Readmission Risk Interventions    09/14/2023    4:17 PM  Readmission Risk Prevention Plan  Post Dischage Appt Complete  Medication  Screening Complete  Transportation Screening Complete

## 2023-09-18 ENCOUNTER — Ambulatory Visit (HOSPITAL_BASED_OUTPATIENT_CLINIC_OR_DEPARTMENT_OTHER): Admission: RE | Admit: 2023-09-18 | Source: Ambulatory Visit

## 2023-09-20 ENCOUNTER — Encounter: Payer: Self-pay | Admitting: Nurse Practitioner

## 2023-09-20 ENCOUNTER — Non-Acute Institutional Stay (SKILLED_NURSING_FACILITY): Payer: Self-pay | Admitting: Nurse Practitioner

## 2023-09-20 DIAGNOSIS — I10 Essential (primary) hypertension: Secondary | ICD-10-CM

## 2023-09-20 DIAGNOSIS — G20A1 Parkinson's disease without dyskinesia, without mention of fluctuations: Secondary | ICD-10-CM

## 2023-09-20 DIAGNOSIS — F419 Anxiety disorder, unspecified: Secondary | ICD-10-CM

## 2023-09-20 DIAGNOSIS — I951 Orthostatic hypotension: Secondary | ICD-10-CM | POA: Insufficient documentation

## 2023-09-20 DIAGNOSIS — Z87898 Personal history of other specified conditions: Secondary | ICD-10-CM | POA: Diagnosis not present

## 2023-09-20 DIAGNOSIS — E1165 Type 2 diabetes mellitus with hyperglycemia: Secondary | ICD-10-CM

## 2023-09-20 DIAGNOSIS — K5901 Slow transit constipation: Secondary | ICD-10-CM

## 2023-09-20 DIAGNOSIS — E871 Hypo-osmolality and hyponatremia: Secondary | ICD-10-CM

## 2023-09-20 DIAGNOSIS — R296 Repeated falls: Secondary | ICD-10-CM | POA: Insufficient documentation

## 2023-09-20 DIAGNOSIS — E785 Hyperlipidemia, unspecified: Secondary | ICD-10-CM

## 2023-09-20 NOTE — Assessment & Plan Note (Signed)
 Hx of Syncope, evaluated by Cardiology, unremarkable workups. CT 09/13/23 no acute intracranial process, s/p R mastoidectomy.

## 2023-09-20 NOTE — Assessment & Plan Note (Signed)
 followed by Neurology, taking Sinemet.

## 2023-09-20 NOTE — Progress Notes (Unsigned)
 Location:   SNF [facility] Room Number: 43A Place of Service:  SNF (31) Provider: Larwance Brysyn Brandenberger NP  Daphna Lafuente X, NP  Patient Care Team: Elton Heid X, NP as PCP - General (Internal Medicine) Charmayne Molly, MD as Consulting Physician (Ophthalmology)  Extended Emergency Contact Information Primary Emergency Contact: [name],[name]  [place] Home Phone: [phone] Mobile Phone: [phone] Relation: Spouse Secondary Emergency Contact: [name],[name] Mobile Phone: [phone] Relation: Son Preferred language: English Interpreter needed? No  Code Status: DNR Goals of care: Advanced Directive information    09/13/2023    3:18 PM  Advanced Directives  Does Patient Have a Medical Advance Directive? Yes  Type of Advance Directive Living will;Healthcare Power of Attorney  Does patient want to make changes to medical advance directive? No - Patient declined  Copy of Healthcare Power of Attorney in Chart? No - copy requested     Chief Complaint  Patient presents with   Hospitalization Follow-up    Patient is being seen for medication review as well as a follow up regarding hospital stay.    HPI:  Pt is a 83 y.o. male seen today for an acute visit for medication review following hospital stay  Hospitalized 09/13/2023 to 09/17/2023 for status post fall, he was a found to have hypotensive, losartan which was discontinued, amlodipine  was added.  CT head and chest x-ray were negative.   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 CT 09/13/23 no acute intracranial process, s/p R mastoidectomy.              Parkinson's disease, followed by Neurology, taking Sinemet .              Anxiety, TSH 4.11, Vit B 872, Vit D 37 07/27/23              HLD, on Atorvastatin , LDL 77 07/27/23             Constipation, every 3-4 days BM, hard, prn MiraLax .             Hyponatremia, Na 132 07/27/23>>130 09/17/23, on salt tab   Past Medical History:  Diagnosis Date   Diabetes mellitus without complication (HCC)    High cholesterol    Hypertension    Parkinson disease (HCC)    Past Surgical History:  Procedure Laterality Date   CATARACT EXTRACTION     MASTOIDECTOMY     VASECTOMY      No Known Allergies  Allergies as of 09/20/2023   No Known Allergies      Medication List        Accurate as of September 20, 2023 11:59 PM. If you have any questions, ask your nurse or doctor.          amLODipine  5 MG tablet Commonly known as: NORVASC  Take 1 tablet (5 mg total) by mouth daily.   aspirin  EC 81 MG tablet Take 81 mg by mouth daily.   atorvastatin  20 MG tablet Commonly known as: LIPITOR Take 20 mg by mouth daily.   carbidopa -levodopa  25-100 MG tablet Commonly known as: SINEMET  IR Take 1-2 tablets by mouth See admin  instructions. Take 1 tablet by mouth in the morning & at bedtime and 2 tablets at noontime   CENTRUM MINIS MEN 50+ PO Take 1 tablet by mouth daily after supper.   polyethylene glycol 17 g packet Commonly known as: MIRALAX  / GLYCOLAX  Take 17 g by mouth daily as needed for mild constipation.        Review of Systems  Constitutional:  Negative for appetite change and fatigue.  HENT:  Negative for congestion and trouble swallowing.   Eyes:  Negative for visual disturbance.  Respiratory:  Negative for cough, shortness of breath and wheezing.   Cardiovascular:  Negative for chest pain, palpitations and leg swelling.  Gastrointestinal:  Positive for constipation. Negative for abdominal pain, nausea and vomiting.  Genitourinary:  Negative for frequency.  Musculoskeletal:  Positive for arthralgias and gait problem.  Neurological:  Positive for light-headedness. Negative for weakness.  Psychiatric/Behavioral:  Negative  for behavioral problems and sleep disturbance. The patient is not nervous/anxious.     Immunization History  Administered Date(s) Administered   Influenza, High Dose Seasonal PF 11/16/2014, 11/27/2015, 12/16/2017, 12/05/2021, 12/08/2022   Moderna Covid-19 Fall Seasonal Vaccine 69yrs & older 12/08/2022   Moderna Covid-19 Vaccine Bivalent Booster 77yrs & up 12/05/2021   PFIZER Comirnaty(Gray Top)Covid-19 Tri-Sucrose Vaccine 12/29/2019, 08/16/2020   PFIZER(Purple Top)SARS-COV-2 Vaccination 04/17/2019, 05/08/2019   Pneumococcal Conjugate-13 11/13/2013   Pneumococcal Polysaccharide-23 07/07/2004, 07/11/2009   RSV,unspecified 04/09/2023   Td (Adult),5 Lf Tetanus Toxid, Preservative Free 08/07/1997   Tdap 11/26/2010   Zoster Recombinant(Shingrix) 12/04/2016, 05/13/2017   Zoster, Live 08/05/2011   Pertinent  Health Maintenance Due  Topic Date Due   INFLUENZA VACCINE  10/15/2023   HEMOGLOBIN A1C  01/27/2024   OPHTHALMOLOGY EXAM  07/25/2024   FOOT EXAM  08/11/2024      07/18/2021   10:57 AM 07/15/2023    2:57 PM  Fall Risk  Falls in the past year?  0  Was there an injury with Fall?  0  Fall Risk Category Calculator  0  (RETIRED) Patient Fall Risk Level Moderate fall risk    Patient at Risk for Falls Due to  No Fall Risks  Fall risk Follow up  Falls evaluation completed     Data saved with a previous flowsheet row definition   Functional Status Survey:    Vitals:   09/20/23 1024 09/21/23 1118  BP: (!) 143/82 (!) 145/82  Pulse: 69   Resp: 18   Temp: (!) 97.1 F (36.2 C)   SpO2: 95%    There is no height or weight on file to calculate BMI. Physical Exam Vitals and nursing note reviewed.  Constitutional:      Appearance: Normal appearance.  HENT:     Head: Normocephalic and atraumatic.     Nose: Nose normal.     Mouth/Throat:     Mouth: Mucous membranes are moist.  Eyes:     Extraocular Movements: Extraocular movements intact.     Conjunctiva/sclera: Conjunctivae normal.      Pupils: Pupils are equal, round, and reactive to light.  Cardiovascular:     Rate and Rhythm: Normal rate and regular rhythm.     Heart sounds: Murmur heard.     Comments: SM left sternal boarder.  Pulmonary:     Effort: Pulmonary effort is normal.     Breath sounds: No wheezing, rhonchi or rales.  Abdominal:     General: Bowel sounds are normal.     Palpations: Abdomen is soft.  Tenderness: There is no abdominal tenderness.  Musculoskeletal:        General: No tenderness. Normal range of motion.     Cervical back: Normal range of motion and neck supple.     Right lower leg: No edema.     Left lower leg: No edema.  Skin:    General: Skin is warm and dry.     Findings: No rash.  Neurological:     General: No focal deficit present.     Mental Status: He is alert and oriented to person, place, and time. Mental status is at baseline.     Motor: Weakness present.     Coordination: Coordination normal.     Gait: Gait abnormal.     Comments: Mild right side facial weakness.   Psychiatric:        Mood and Affect: Mood normal.        Behavior: Behavior normal.        Thought Content: Thought content normal.        Judgment: Judgment normal.     Labs reviewed: Recent Labs    09/15/23 0550 09/16/23 0525 09/17/23 0533  NA 128* 128* 130*  K 4.3 3.6 3.6  CL 98 99 96*  CO2 22 22 23   GLUCOSE 112* 110* 105*  BUN 12 10 13   CREATININE 0.86 0.77 0.88  CALCIUM  8.3* 8.4* 8.5*  MG 1.8 1.7 1.8  PHOS 2.9  --   --    Recent Labs    07/27/23 0809 08/30/23 1025 09/13/23 1538 09/14/23 0547  AST 22 26  --  19  ALT 36 11  --  7  ALKPHOS  --  64  --  68  BILITOT 1.2 1.4*  --  1.6*  PROT 6.9 6.6  --  5.7*  ALBUMIN  --  3.6 2.6* 3.2*   Recent Labs    07/27/23 0809 08/30/23 1025 09/13/23 1538 09/15/23 0550 09/16/23 0525 09/17/23 0533  WBC 9.8 9.6   < > 11.1* 11.4* 9.7  NEUTROABS 7,056 7.9*  --   --   --   --   HGB 16.3 15.5   < > 13.9 13.6 13.9  HCT 47.0 46.0   < >  39.9 38.6* 38.9*  MCV 95.3 97.3   < > 94.8 94.1 93.5  PLT 119* 179   < > 186 177 184   < > = values in this interval not displayed.   Lab Results  Component Value Date   TSH 2.059 09/14/2023   Lab Results  Component Value Date   HGBA1C 6.7 (H) 07/27/2023   Lab Results  Component Value Date   CHOL 153 07/27/2023   HDL 58 07/27/2023   LDLCALC 77 07/27/2023   TRIG 98 07/27/2023   CHOLHDL 2.6 07/27/2023    Significant Diagnostic Results in last 30 days:  ECHOCARDIOGRAM COMPLETE Result Date: 09/15/2023    ECHOCARDIOGRAM REPORT   Patient Name:   Bob Long Date of Exam: 09/15/2023 Medical Rec #:  [id number]       Height:       68.0 in Accession #:    [id number]      Weight:       159.2 lb Date of Birth:  05/03/1940        BSA:          1.855 m Patient Age:    83 years        BP:  148/82 mmHg Patient Gender: M               HR:           79 bpm. Exam Location:  Inpatient Procedure: 2D Echo, Color Doppler and Cardiac Doppler (Both Spectral and Color            Flow Doppler were utilized during procedure). Indications:    Cardiomyopathy Unspecified I42.9  History:        Patient has no prior history of Echocardiogram examinations.                 Risk Factors:Diabetes and Hypertension.  Sonographer:    Tinnie Gosling RDCS Referring Phys: [id number] BURGESS BROCKS AMIN IMPRESSIONS  1. Left ventricular ejection fraction, by estimation, is 65 to 70%. The left ventricle has normal function. The left ventricle has no regional wall motion abnormalities. There is mild concentric left ventricular hypertrophy. Left ventricular diastolic parameters are consistent with Grade I diastolic dysfunction (impaired relaxation).  2. Right ventricular systolic function is mildly reduced. The right ventricular size is mildly enlarged. There is normal pulmonary artery systolic pressure. The estimated right ventricular systolic pressure is 31.3 mmHg.  3. The mitral valve is normal in structure. No evidence of mitral valve  regurgitation. No evidence of mitral stenosis.  4. The aortic valve is tricuspid. There is moderate calcification of the aortic valve. Aortic valve regurgitation is trivial. Mild aortic valve stenosis. Aortic valve area, by VTI measures 1.53 cm. Aortic valve mean gradient measures 11.0 mmHg.  5. The inferior vena cava is normal in size with greater than 50% respiratory variability, suggesting right atrial pressure of 3 mmHg. FINDINGS  Left Ventricle: Left ventricular ejection fraction, by estimation, is 65 to 70%. The left ventricle has normal function. The left ventricle has no regional wall motion abnormalities. The left ventricular internal cavity size was normal in size. There is  mild concentric left ventricular hypertrophy. Left ventricular diastolic parameters are consistent with Grade I diastolic dysfunction (impaired relaxation). Right Ventricle: The right ventricular size is mildly enlarged. No increase in right ventricular wall thickness. Right ventricular systolic function is mildly reduced. There is normal pulmonary artery systolic pressure. The tricuspid regurgitant velocity  is 2.66 m/s, and with an assumed right atrial pressure of 3 mmHg, the estimated right ventricular systolic pressure is 31.3 mmHg. Left Atrium: Left atrial size was normal in size. Right Atrium: Right atrial size was normal in size. Pericardium: There is no evidence of pericardial effusion. Mitral Valve: The mitral valve is normal in structure. There is mild calcification of the mitral valve leaflet(s). Mild mitral annular calcification. No evidence of mitral valve regurgitation. No evidence of mitral valve stenosis. Tricuspid Valve: The tricuspid valve is normal in structure. Tricuspid valve regurgitation is trivial. Aortic Valve: The aortic valve is tricuspid. There is moderate calcification of the aortic valve. Aortic valve regurgitation is trivial. Mild aortic stenosis is present. Aortic valve mean gradient measures 11.0 mmHg.  Aortic valve peak gradient measures 19.4 mmHg. Aortic valve area, by VTI measures 1.53 cm. Pulmonic Valve: The pulmonic valve was normal in structure. Pulmonic valve regurgitation is trivial. Aorta: The aortic root is normal in size and structure. Venous: The inferior vena cava is normal in size with greater than 50% respiratory variability, suggesting right atrial pressure of 3 mmHg. IAS/Shunts: No atrial level shunt detected by color flow Doppler.  LEFT VENTRICLE PLAX 2D LVIDd:         3.70 cm   Diastology  LVIDs:         3.00 cm   LV e' medial:    3.92 cm/s LV PW:         1.30 cm   LV E/e' medial:  14.8 LV IVS:        1.30 cm   LV e' lateral:   2.72 cm/s LVOT diam:     1.90 cm   LV E/e' lateral: 21.4 LV SV:         56 LV SV Index:   30 LVOT Area:     2.84 cm  RIGHT VENTRICLE RV S prime:     8.59 cm/s TAPSE (M-mode): 2.0 cm LEFT ATRIUM         Index LA diam:    2.70 cm 1.46 cm/m  AORTIC VALVE AV Area (Vmax):    1.17 cm AV Area (Vmean):   1.29 cm AV Area (VTI):     1.53 cm AV Vmax:           220.00 cm/s AV Vmean:          144.400 cm/s AV VTI:            0.366 m AV Peak Grad:      19.4 mmHg AV Mean Grad:      11.0 mmHg LVOT Vmax:         90.50 cm/s LVOT Vmean:        65.800 cm/s LVOT VTI:          0.198 m LVOT/AV VTI ratio: 0.54  AORTA Ao Root diam: 3.20 cm MITRAL VALVE                TRICUSPID VALVE MV Area (PHT): 2.54 cm     TR Peak grad:   28.3 mmHg MV Decel Time: 299 msec     TR Vmax:        266.00 cm/s MV E velocity: 58.10 cm/s MV A velocity: 100.00 cm/s  SHUNTS MV E/A ratio:  0.58         Systemic VTI:  0.20 m                             Systemic Diam: 1.90 cm Dalton McleanMD Electronically signed by Ezra Kanner Signature Date/Time: 09/15/2023/1:11:12 PM    Final    CT Head Wo Contrast Result Date: 09/13/2023 CLINICAL DATA:  Lightheaded with frequent falls. EXAM: CT HEAD WITHOUT CONTRAST TECHNIQUE: Contiguous axial images were obtained from the base of the skull through the vertex without  intravenous contrast. RADIATION DOSE REDUCTION: This exam was performed according to the departmental dose-optimization program which includes automated exposure control, adjustment of the mA and/or kV according to patient size and/or use of iterative reconstruction technique. COMPARISON:  Jul 18, 2021 FINDINGS: Brain: There is generalized cerebral atrophy with widening of the extra-axial spaces and ventricular dilatation. There are areas of decreased attenuation within the white matter tracts of the supratentorial brain, consistent with microvascular disease changes. Vascular: No hyperdense vessel or unexpected calcification. Skull: Normal. Negative for fracture or focal lesion. Sinuses/Orbits: There is mild anterior left ethmoid sinus mucosal thickening. Postoperative changes consistent with prior right mastoidectomy are noted. Other: None. IMPRESSION: 1. 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 2. No acute intracranial abnormality. 3. Evidence of prior right mastoidectomy. Electronically Signed   By: Suzen Dials M.D.   On: 09/13/2023 18:35   DG Chest [facility]  Result Date: 09/13/2023 CLINICAL DATA:  Shortness of breath EXAM: PORTABLE CHEST 1 VIEW COMPARISON:  08/30/2023 FINDINGS: No acute airspace disease or effusion. Stable cardiomediastinal silhouette with aortic atherosclerosis. No pneumothorax. Probable calcified granuloma in the left lower lung. IMPRESSION: No active disease. Electronically Signed   By: Luke Bun M.D.   On: 09/13/2023 15:53   DG Chest Port 1 View Result Date: 08/30/2023 CLINICAL DATA:  Syncope EXAM: PORTABLE CHEST 1 VIEW COMPARISON:  X-ray 07/18/2021 FINDINGS: Hyperinflation. No consolidation, pneumothorax or effusion. No edema. Normal cardiopericardial silhouette. Calcified aorta. Overlapping cardiac leads. Kyphotic x-ray obscures the left lung apex. IMPRESSION: No acute cardiopulmonary disease. Hyperinflation with chronic  changes. Kyphotic x-ray obscures the left lung apex. Electronically Signed   By: Ranell Bring M.D.   On: 08/30/2023 12:38    Assessment/Plan: Orthostatic hypotension  09/17/23 Bp lying 174/103, standing 131/78 Assist the patient to change positions slowly, continue to monitor blood pressure  HTN (hypertension) Loose blood pressure control, on Amlodipine , off  Valsartan  Type 2 diabetes mellitus (HCC) diet controlled, Hgb A1c 6.7 07/27/23  History of syncope Hx of Syncope, evaluated by Cardiology, unremarkable workups. CT 09/13/23 no acute intracranial process, s/p R mastoidectomy.   Frequent falls Parkinson disease, orthostatic hypotension, increased frailty are contributory. Therapy to eval and tx.   Parkinson's disease (HCC) followed by Neurology, taking Sinemet .   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  Hyponatremia Na 132 07/27/23>>130 09/17/23, on salt tab, update CMP/eGFR    Family/ staff Communication: Plan of care reviewed with the patient and charge nurse  Labs/tests ordered:  CMP/eGFR next lab

## 2023-09-20 NOTE — Assessment & Plan Note (Signed)
 09/17/23 Bp lying 174/103, standing 131/78 Assist the patient to change positions slowly, continue to monitor blood pressure

## 2023-09-20 NOTE — Assessment & Plan Note (Signed)
 Chronic, every 3-4 days BM, hard, prn MiraLax .

## 2023-09-20 NOTE — Assessment & Plan Note (Signed)
 Loose blood pressure control, on Amlodipine , off  Valsartan

## 2023-09-20 NOTE — Assessment & Plan Note (Signed)
 Parkinson disease, orthostatic hypotension, increased frailty are contributory. Therapy to eval and tx.

## 2023-09-20 NOTE — Assessment & Plan Note (Signed)
 on Atorvastatin, LDL 77 07/27/23

## 2023-09-20 NOTE — Assessment & Plan Note (Signed)
 Na 132 07/27/23>>130 09/17/23, on salt tab, update CMP/eGFR

## 2023-09-20 NOTE — Assessment & Plan Note (Signed)
 diet controlled, Hgb A1c 6.7 07/27/23

## 2023-09-20 NOTE — Assessment & Plan Note (Signed)
 TSH 4.11, Vit B 872, Vit D 37 07/27/23

## 2023-09-21 ENCOUNTER — Encounter: Payer: Self-pay | Admitting: Nurse Practitioner

## 2023-09-21 ENCOUNTER — Ambulatory Visit: Admitting: Neurology

## 2023-09-21 ENCOUNTER — Ambulatory Visit

## 2023-09-23 ENCOUNTER — Encounter: Admitting: Nurse Practitioner

## 2023-09-23 ENCOUNTER — Non-Acute Institutional Stay (SKILLED_NURSING_FACILITY): Payer: Self-pay | Admitting: Sports Medicine

## 2023-09-23 ENCOUNTER — Encounter: Payer: Self-pay | Admitting: Sports Medicine

## 2023-09-23 DIAGNOSIS — E871 Hypo-osmolality and hyponatremia: Secondary | ICD-10-CM | POA: Diagnosis not present

## 2023-09-23 DIAGNOSIS — G20A1 Parkinson's disease without dyskinesia, without mention of fluctuations: Secondary | ICD-10-CM

## 2023-09-23 DIAGNOSIS — K5901 Slow transit constipation: Secondary | ICD-10-CM

## 2023-09-23 DIAGNOSIS — I951 Orthostatic hypotension: Secondary | ICD-10-CM | POA: Diagnosis not present

## 2023-09-23 DIAGNOSIS — I10 Essential (primary) hypertension: Secondary | ICD-10-CM

## 2023-09-23 NOTE — Progress Notes (Signed)
 Provider:  Pennelope Blazing Location:   [facility] Room Number: 43-A Place of Service:  SNF (31)  PCP: Mast, Man X, NP Patient Care Team: Mast, Man X, NP as PCP - General (Internal Medicine) Bob Molly, MD as Consulting Physician (Ophthalmology)  Extended Emergency Contact Information Primary Emergency Contact: [name],[name]  [place] Home Phone: [phone] Mobile Phone: [phone] Relation: Spouse Secondary Emergency Contact: [name],[name] Mobile Phone: [phone] Relation: Son Preferred language: English Interpreter needed? No  Code Status: DNR Goals of Care: Advanced Directive information    09/23/2023    2:06 PM  Advanced Directives  Does Patient Have a Medical Advance Directive? Yes  Type of Advance Directive Out of facility DNR (pink MOST or yellow form)  Does patient want to make changes to medical advance directive? No - Patient declined      Chief Complaint  Patient presents with   New Admit To SNF    HPI: Patient is a 83 y.o. male with past medical history of hyponatremia, Parkinson's disease, hyperlipidemia, hypertension, type 2 diabetes is seen today for admission to skilled care after his recent hospitalization. Patient seen and examined in his room.  He is laying on his bed watching TV.SABRA  He seems pleasant and comfortable and does not appear to be in distress. Patient complains of dizzy or lightheadedness, when standing up.  He is currently able to walk few steps with the help of physical therapy. Patient denies chest pain, shortness of breath, abdominal pain, nausea, vomiting, dysuria, hematuria, bloody or dark-colored stools. As per nursing staff no major concerns.  As per d/c summary   Hospital Course:   83 year old male with Parkinson's, hypertension, hyperlipidemia who presented to emergency department after a fall. Patient got up and felt lightheaded. He has been having an ongoing issues  with weakness and lightheadedness. EMS was called he was given some IV fluids. He was found to be hypotensive and transported to the ER. In the ER noted to have multiple abnormal electrolytes including hypokalemia, hypomagnesemia, hyponatremia. CT head and chest x-ray were negative. ''    Past Medical History:  Diagnosis Date   Diabetes mellitus without complication (HCC)    High cholesterol    Hypertension    Parkinson disease (HCC)    Past Surgical History:  Procedure Laterality Date   CATARACT EXTRACTION     MASTOIDECTOMY     VASECTOMY      reports that he has quit smoking. His smoking use included cigarettes. He has never used smokeless tobacco. He reports current alcohol use of about 7.0 standard drinks of alcohol per week. He reports that he does not use drugs. Social History   Socioeconomic History   Marital status: Married    Spouse name: Not on file   Number of children: 3   Years of education: Not on file   Highest education level: Bachelor's degree (e.g., BA, AB, BS)  Occupational History   Not on file  Tobacco Use   Smoking status: Former    Types: Cigarettes   Smokeless tobacco: Never   Tobacco comments:    Quit in 2005  Vaping Use   Vaping status: Never Used  Substance and Sexual Activity   Alcohol use: Yes    Alcohol/week: 7.0 standard drinks of alcohol    Types: 7 Glasses of wine per week    Comment: white wine ever evening   Drug use: Never   Sexual activity: Not on file  Other Topics Concern  Not on file  Social History Narrative   Not on file   Social Drivers of Health   Financial Resource Strain: Low Risk  (07/11/2021)   Received from [facility], [facility] visits prior to 05/16/2022.   Overall Financial Resource Strain (CARDIA)    Difficulty of Paying Living Expenses: Not hard at all  Food Insecurity: No Food Insecurity (09/13/2023)   Hunger Vital Sign    Worried About Running Out of Food in the Last Year: Never true     Ran Out of Food in the Last Year: Never true  Transportation Needs: No Transportation Needs (09/13/2023)   PRAPARE - Administrator, Civil Service (Medical): No    Lack of Transportation (Non-Medical): No  Physical Activity: Unknown (07/11/2021)   Received from [facility], [facility] visits prior to 05/16/2022.   Exercise Vital Sign    On average, how many days per week do you engage in moderate to strenuous exercise (like a brisk walk)?: 0 days    Minutes of Exercise per Session: Not on file  Stress: No Stress Concern Present (07/11/2021)   Received from [facility], [facility] visits prior to 05/16/2022.   [employer] of Occupational Health - Occupational Stress Questionnaire    Feeling of Stress : Only a little  Social Connections: Socially Isolated (09/13/2023)   Social Connection and Isolation Panel    Frequency of Communication with Friends and Family: Never    Frequency of Social Gatherings with Friends and Family: Never    Attends Religious Services: Never    Database administrator or Organizations: No    Attends Banker Meetings: Never    Marital Status: Married  Catering manager Violence: Not At Risk (09/13/2023)   Humiliation, Afraid, Rape, and Kick questionnaire    Fear of Current or Ex-Partner: No    Emotionally Abused: No    Physically Abused: No    Sexually Abused: No    Functional Status Survey:    No family history on file.  Health Maintenance  Topic Date Due   DTaP/Tdap/Td (2 - Td or Tdap) 11/25/2020   COVID-19 Vaccine (7 - Pfizer risk 2024-25 season) 12/14/2023 (Originally 06/07/2023)   INFLUENZA VACCINE  10/15/2023   HEMOGLOBIN A1C  01/27/2024   OPHTHALMOLOGY EXAM  07/25/2024   FOOT EXAM  08/11/2024   Diabetic kidney evaluation - Urine ACR  09/01/2024   Medicare Annual Wellness (AWV)  09/08/2024   Diabetic kidney evaluation - eGFR measurement  09/16/2024   Pneumococcal Vaccine:  50+ Years  Completed   Zoster Vaccines- Shingrix  Completed   Hepatitis B Vaccines  Aged Out   HPV VACCINES  Aged Out   Meningococcal B Vaccine  Aged Out    No Known Allergies  Allergies as of 09/23/2023   No Known Allergies      Medication List        Accurate as of September 23, 2023  2:07 PM. If you have any questions, ask your nurse or doctor.          amLODipine  5 MG tablet Commonly known as: NORVASC  Take 1 tablet (5 mg total) by mouth daily.   aspirin  EC 81 MG tablet Take 81 mg by mouth daily.   atorvastatin  20 MG tablet Commonly known as: LIPITOR Take 20 mg by mouth daily.   carbidopa -levodopa  25-100 MG tablet Commonly known as: SINEMET  IR Take 1-2 tablets by mouth See admin  instructions. Take 1 tablet by mouth in the morning & at bedtime and 2 tablets at noontime   carbidopa -levodopa  25-100 MG tablet Commonly known as: SINEMET  IR Take 2 tablets by mouth daily.   CENTRUM MINIS MEN 50+ PO Take 1 tablet by mouth daily after supper.   polyethylene glycol 17 g packet Commonly known as: MIRALAX  / GLYCOLAX  Take 17 g by mouth daily as needed for mild constipation.        Review of Systems  Constitutional:  Negative for fever.  Respiratory:  Negative for cough and shortness of breath.   Cardiovascular:  Negative for chest pain and leg swelling.  Gastrointestinal:  Negative for abdominal distention, abdominal pain, blood in stool, constipation, diarrhea and nausea.  Genitourinary:  Negative for dysuria.  Neurological:  Positive for dizziness.    Vitals:   09/23/23 1249  BP: (!) 160/87  Pulse: 72  Resp: 18  Temp: (!) 97.1 F (36.2 C)  SpO2: 95%  Weight: 161 lb 12.8 oz (73.4 kg)  Height: 5' 8 (1.727 m)   Body mass index is 24.6 kg/m. Physical Exam Constitutional:      Appearance: Normal appearance.  Cardiovascular:     Rate and Rhythm: Normal rate and regular rhythm.     Pulses: Normal pulses.     Heart sounds: Normal heart sounds.  Pulmonary:      Effort: No respiratory distress.     Breath sounds: No stridor. No wheezing or rales.  Abdominal:     General: Bowel sounds are normal. There is no distension.     Palpations: Abdomen is soft.     Tenderness: There is no abdominal tenderness. There is no guarding.  Musculoskeletal:        General: No swelling.  Neurological:     Mental Status: He is alert. Mental status is at baseline.     Motor: No weakness.     Labs reviewed: Basic Metabolic Panel: Recent Labs    09/15/23 0550 09/16/23 0525 09/17/23 0533  NA 128* 128* 130*  K 4.3 3.6 3.6  CL 98 99 96*  CO2 22 22 23   GLUCOSE 112* 110* 105*  BUN 12 10 13   CREATININE 0.86 0.77 0.88  CALCIUM  8.3* 8.4* 8.5*  MG 1.8 1.7 1.8  PHOS 2.9  --   --    Liver Function Tests: Recent Labs    07/27/23 0809 08/30/23 1025 09/13/23 1538 09/14/23 0547  AST 22 26  --  19  ALT 36 11  --  7  ALKPHOS  --  64  --  68  BILITOT 1.2 1.4*  --  1.6*  PROT 6.9 6.6  --  5.7*  ALBUMIN  --  3.6 2.6* 3.2*   No results for input(s): LIPASE, AMYLASE in the last 8760 hours. No results for input(s): AMMONIA in the last 8760 hours. CBC: Recent Labs    07/27/23 0809 08/30/23 1025 09/13/23 1538 09/15/23 0550 09/16/23 0525 09/17/23 0533  WBC 9.8 9.6   < > 11.1* 11.4* 9.7  NEUTROABS 7,056 7.9*  --   --   --   --   HGB 16.3 15.5   < > 13.9 13.6 13.9  HCT 47.0 46.0   < > 39.9 38.6* 38.9*  MCV 95.3 97.3   < > 94.8 94.1 93.5  PLT 119* 179   < > 186 177 184   < > = values in this interval not displayed.   Cardiac Enzymes: No results for input(s): CKTOTAL, CKMB, CKMBINDEX,  TROPONINI in the last 8760 hours. BNP: Invalid input(s): POCBNP Lab Results  Component Value Date   HGBA1C 6.7 (H) 07/27/2023   Lab Results  Component Value Date   TSH 2.059 09/14/2023   Lab Results  Component Value Date   VITAMINB12 872 07/27/2023   No results found for: FOLATE No results found for: IRON, TIBC, FERRITIN  Imaging and  Procedures obtained prior to SNF admission: CT Head Wo Contrast Result Date: 09/13/2023 CLINICAL DATA:  Lightheaded with frequent falls. EXAM: CT HEAD WITHOUT CONTRAST TECHNIQUE: Contiguous axial images were obtained from the base of the skull through the vertex without intravenous contrast. RADIATION DOSE REDUCTION: This exam was performed according to the departmental dose-optimization program which includes automated exposure control, adjustment of the mA and/or kV according to patient size and/or use of iterative reconstruction technique. COMPARISON:  Jul 18, 2021 FINDINGS: Brain: There is generalized cerebral atrophy with widening of the extra-axial spaces and ventricular dilatation. There are areas of decreased attenuation within the white matter tracts of the supratentorial brain, consistent with microvascular disease changes. Vascular: No hyperdense vessel or unexpected calcification. Skull: Normal. Negative for fracture or focal lesion. Sinuses/Orbits: There is mild anterior left ethmoid sinus mucosal thickening. Postoperative changes consistent with prior right mastoidectomy are noted. Other: None. IMPRESSION: 1. 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 2. No acute intracranial abnormality. 3. Evidence of prior right mastoidectomy. Electronically Signed   By: Suzen Dials M.D.   On: 09/13/2023 18:35   DG Chest Port 1 View Result Date: 09/13/2023 CLINICAL DATA:  Shortness of breath EXAM: PORTABLE CHEST 1 VIEW COMPARISON:  08/30/2023 FINDINGS: No acute airspace disease or effusion. Stable cardiomediastinal silhouette with aortic atherosclerosis. No pneumothorax. Probable calcified granuloma in the left lower lung. IMPRESSION: No active disease. Electronically Signed   By: Luke Bun M.D.   On: 09/13/2023 15:53    Assessment/Plan   Orthostatic hypotension History of recurrent falls History of Parkinson's disease Orthostatic vitals  positive Patient complains of dizziness when standing up Instructed patient to raise Foley when standing up  Continue with physical therapy Continue with TED hose  History of hyponatremia    Latest Ref Rng & Units 09/17/2023    5:33 AM 09/16/2023    5:25 AM 09/15/2023    5:50 AM  BMP  Glucose 70 - 99 mg/dL 894  889  887   BUN 8 - 23 mg/dL 13  10  12    Creatinine 0.61 - 1.24 mg/dL 9.11  9.22  9.13   Sodium 135 - 145 mmol/L 130  128  128   Potassium 3.5 - 5.1 mmol/L 3.6  3.6  4.3   Chloride 98 - 111 mmol/L 96  99  98   CO2 22 - 32 mmol/L 23  22  22    Calcium  8.9 - 10.3 mg/dL 8.5  8.4  8.3   Will check BMP.    History of Parkinson's disease Continue with Sinemet    Hypertension Continue with amlodipine  Monitor blood pressure  Constipation Continue with MiraLAX  as needed    30 min Total time spent for obtaining history,  performing a medically appropriate examination and evaluation, reviewing the tests   documenting clinical information in the electronic or other health record,  ,care coordination (not separately reported)

## 2023-09-30 ENCOUNTER — Non-Acute Institutional Stay (SKILLED_NURSING_FACILITY): Payer: Self-pay | Admitting: Sports Medicine

## 2023-09-30 ENCOUNTER — Encounter: Payer: Self-pay | Admitting: Sports Medicine

## 2023-09-30 DIAGNOSIS — G20A1 Parkinson's disease without dyskinesia, without mention of fluctuations: Secondary | ICD-10-CM

## 2023-09-30 DIAGNOSIS — R42 Dizziness and giddiness: Secondary | ICD-10-CM | POA: Diagnosis not present

## 2023-09-30 DIAGNOSIS — I10 Essential (primary) hypertension: Secondary | ICD-10-CM | POA: Diagnosis not present

## 2023-09-30 DIAGNOSIS — E871 Hypo-osmolality and hyponatremia: Secondary | ICD-10-CM

## 2023-09-30 DIAGNOSIS — E785 Hyperlipidemia, unspecified: Secondary | ICD-10-CM

## 2023-09-30 DIAGNOSIS — E119 Type 2 diabetes mellitus without complications: Secondary | ICD-10-CM

## 2023-09-30 MED ORDER — AMLODIPINE BESYLATE 5 MG PO TABS: 10.0000 mg | ORAL_TABLET | Freq: Every day | ORAL | Status: DC

## 2023-09-30 NOTE — Progress Notes (Signed)
 Location:  [facility]  Nursing Home Room Number: N043-A Place of Service:  SNF (31)  Provider: Sherlynn Albert, MD   PCP: Mast, Man X, NP Patient Care Team: Mast, Man X, NP as PCP - General (Internal Medicine) Charmayne Molly, MD as Consulting Physician (Ophthalmology)  Extended Emergency Contact Information Primary Emergency Contact: [name],[name]  [place] Home Phone: [phone] Mobile Phone: [phone] Relation: Spouse Secondary Emergency Contact: [name],[name] Mobile Phone: [phone] Relation: Son Preferred language: English Interpreter needed? No  Code Status: DNR Goals of care:  Advanced Directive information    09/30/2023   10:46 AM  Advanced Directives  Does Patient Have a Medical Advance Directive? Yes  Type of Advance Directive Out of facility DNR (pink MOST or yellow form)  Does patient want to make changes to medical advance directive? No - Patient declined     No Known Allergies  Chief Complaint  Patient presents with   Discharge    Patient is being discharged.     HPI:  83 y.o. male with past medical history of hyponatremia, dizziness, hyperlipidemia, hypertension, diet-controlled diabetes is seen today for discharge from skilled care to independent living. Patient seen and examined in his room.  He is laying on his bed complaining game on his iPad.  Wife at bedside. Patient seems pleasant and comfortable and does not appear to be in distress. Patient complains of dizziness when standing up.  He is able to transfer himself from bed to chair and able to walk.  Wife reports that she had home health for physical therapy after discharge. Patient denies chest pain, shortness of breath, abdominal pain, nausea, vomiting, dysuria, hematuria, bloody or dark stools.      Past Medical History:  Diagnosis Date   Diabetes mellitus without complication (HCC)    High cholesterol    Hypertension    Parkinson disease  (HCC)     Past Surgical History:  Procedure Laterality Date   CATARACT EXTRACTION     MASTOIDECTOMY     VASECTOMY        reports that he has quit smoking. His smoking use included cigarettes. He has never used smokeless tobacco. He reports current alcohol use of about 7.0 standard drinks of alcohol per week. He reports that he does not use drugs. Social History   Socioeconomic History   Marital status: Married    Spouse name: Not on file   Number of children: 3   Years of education: Not on file   Highest education level: Bachelor's degree (e.g., BA, AB, BS)  Occupational History   Not on file  Tobacco Use   Smoking status: Former    Types: Cigarettes   Smokeless tobacco: Never   Tobacco comments:    Quit in 2005  Vaping Use   Vaping status: Never Used  Substance and Sexual Activity   Alcohol use: Yes    Alcohol/week: 7.0 standard drinks of alcohol    Types: 7 Glasses of wine per week    Comment: white wine ever evening   Drug use: Never   Sexual activity: Not on file  Other Topics Concern   Not on file  Social History Narrative   Not on file   Social Drivers of Health   Financial Resource Strain: Low Risk  (07/11/2021)   Received from [facility], [facility] visits prior to 05/16/2022.   Overall Financial Resource Strain (CARDIA)    Difficulty of Paying Living Expenses: Not hard at all  Food Insecurity: No Food Insecurity (09/13/2023)   Hunger Vital Sign    Worried About Running Out of Food in the Last Year: Never true    Ran Out of Food in the Last Year: Never true  Transportation Needs: No Transportation Needs (09/13/2023)   PRAPARE - Administrator, Civil Service (Medical): No    Lack of Transportation (Non-Medical): No  Physical Activity: Unknown (07/11/2021)   Received from [facility], [facility] visits prior to 05/16/2022.   Exercise Vital Sign    On average, how many days per week do you engage in  moderate to strenuous exercise (like a brisk walk)?: 0 days    Minutes of Exercise per Session: Not on file  Stress: No Stress Concern Present (07/11/2021)   Received from [facility], [facility] visits prior to 05/16/2022.   [employer] of Occupational Health - Occupational Stress Questionnaire    Feeling of Stress : Only a little  Social Connections: Socially Isolated (09/13/2023)   Social Connection and Isolation Panel    Frequency of Communication with Friends and Family: Never    Frequency of Social Gatherings with Friends and Family: Never    Attends Religious Services: Never    Database administrator or Organizations: No    Attends Banker Meetings: Never    Marital Status: Married  Catering manager Violence: Not At Risk (09/13/2023)   Humiliation, Afraid, Rape, and Kick questionnaire    Fear of Current or Ex-Partner: No    Emotionally Abused: No    Physically Abused: No    Sexually Abused: No   Functional Status Survey:    No Known Allergies  Pertinent  Health Maintenance Due  Topic Date Due   INFLUENZA VACCINE  10/15/2023   HEMOGLOBIN A1C  01/27/2024   OPHTHALMOLOGY EXAM  07/25/2024   FOOT EXAM  08/11/2024    Medications: Allergies as of 09/30/2023   No Known Allergies      Medication List        Accurate as of September 30, 2023 11:10 AM. If you have any questions, ask your nurse or doctor.          amLODipine  5 MG tablet Commonly known as: NORVASC  Take 1 tablet (5 mg total) by mouth daily.   aspirin  EC 81 MG tablet Take 81 mg by mouth daily.   atorvastatin  20 MG tablet Commonly known as: LIPITOR Take 20 mg by mouth daily.   carbidopa -levodopa  25-100 MG tablet Commonly known as: SINEMET  IR Take 1-2 tablets by mouth See admin instructions. Take 1 tablet by mouth in the morning & at bedtime and 2 tablets at noontime   carbidopa -levodopa  25-100 MG tablet Commonly known as: SINEMET  IR Take 2 tablets by mouth  daily.   CENTRUM MINIS MEN 50+ PO Take 1 tablet by mouth daily after supper.   polyethylene glycol 17 g packet Commonly known as: MIRALAX  / GLYCOLAX  Take 17 g by mouth daily as needed for mild constipation.   tuberculin 5 UNIT/0.1ML injection Inject 0.1 mLs into the skin once. Inject  mL one time a day.        Review of Systems  Constitutional:  Negative for chills and fever.  HENT:  Negative for sore throat.   Respiratory:  Negative for cough, shortness of breath and wheezing.   Cardiovascular:  Negative for chest pain, palpitations and leg swelling.  Gastrointestinal:  Negative for abdominal distention, abdominal pain, blood in stool,  constipation, diarrhea, nausea and vomiting.  Genitourinary:  Negative for dysuria.  Neurological:  Positive for dizziness. Negative for weakness and numbness.    Vitals:   09/30/23 1108  BP: (!) 165/90  Pulse: 68  Resp: 18  Temp: (!) 97.1 F (36.2 C)  SpO2: 97%  Weight: 161 lb 9.6 oz (73.3 kg)  Height: 5' 8 (1.727 m)   Body mass index is 24.57 kg/m. Physical Exam Constitutional:      Appearance: Normal appearance.  HENT:     Head: Normocephalic and atraumatic.  Cardiovascular:     Rate and Rhythm: Normal rate and regular rhythm.     Pulses: Normal pulses.     Heart sounds: Normal heart sounds.  Pulmonary:     Effort: No respiratory distress.     Breath sounds: No stridor. No wheezing or rales.  Abdominal:     General: Bowel sounds are normal. There is no distension.     Palpations: Abdomen is soft.     Tenderness: There is no abdominal tenderness. There is no guarding.  Musculoskeletal:        General: No swelling.  Neurological:     Mental Status: He is alert. Mental status is at baseline.     Motor: No weakness.     Labs reviewed: Basic Metabolic Panel: Recent Labs    09/15/23 0550 09/16/23 0525 09/17/23 0533  NA 128* 128* 130*  K 4.3 3.6 3.6  CL 98 99 96*  CO2 22 22 23   GLUCOSE 112* 110* 105*  BUN 12 10  13   CREATININE 0.86 0.77 0.88  CALCIUM  8.3* 8.4* 8.5*  MG 1.8 1.7 1.8  PHOS 2.9  --   --    Liver Function Tests: Recent Labs    07/27/23 0809 08/30/23 1025 09/13/23 1538 09/14/23 0547  AST 22 26  --  19  ALT 36 11  --  7  ALKPHOS  --  64  --  68  BILITOT 1.2 1.4*  --  1.6*  PROT 6.9 6.6  --  5.7*  ALBUMIN  --  3.6 2.6* 3.2*   No results for input(s): LIPASE, AMYLASE in the last 8760 hours. No results for input(s): AMMONIA in the last 8760 hours. CBC: Recent Labs    07/27/23 0809 08/30/23 1025 09/13/23 1538 09/15/23 0550 09/16/23 0525 09/17/23 0533  WBC 9.8 9.6   < > 11.1* 11.4* 9.7  NEUTROABS 7,056 7.9*  --   --   --   --   HGB 16.3 15.5   < > 13.9 13.6 13.9  HCT 47.0 46.0   < > 39.9 38.6* 38.9*  MCV 95.3 97.3   < > 94.8 94.1 93.5  PLT 119* 179   < > 186 177 184   < > = values in this interval not displayed.   Cardiac Enzymes: No results for input(s): CKTOTAL, CKMB, CKMBINDEX, TROPONINI in the last 8760 hours. BNP: Invalid input(s): POCBNP CBG: Recent Labs    08/30/23 1018  GLUCAP 252*    Procedures and Imaging Studies During Stay: ECHOCARDIOGRAM COMPLETE Result Date: 09/15/2023    ECHOCARDIOGRAM REPORT   Patient Name:   RITHIK ODEA Date of Exam: 09/15/2023 Medical Rec #:  [id number]       Height:       68.0 in Accession #:    [id number]      Weight:       159.2 lb Date of Birth:  07-19-1940        BSA:  1.855 m Patient Age:    83 years        BP:           148/82 mmHg Patient Gender: M               HR:           79 bpm. Exam Location:  Inpatient Procedure: 2D Echo, Color Doppler and Cardiac Doppler (Both Spectral and Color            Flow Doppler were utilized during procedure). Indications:    Cardiomyopathy Unspecified I42.9  History:        Patient has no prior history of Echocardiogram examinations.                 Risk Factors:Diabetes and Hypertension.  Sonographer:    Tinnie Gosling RDCS Referring Phys: [id number] BURGESS BROCKS AMIN  IMPRESSIONS  1. Left ventricular ejection fraction, by estimation, is 65 to 70%. The left ventricle has normal function. The left ventricle has no regional wall motion abnormalities. There is mild concentric left ventricular hypertrophy. Left ventricular diastolic parameters are consistent with Grade I diastolic dysfunction (impaired relaxation).  2. Right ventricular systolic function is mildly reduced. The right ventricular size is mildly enlarged. There is normal pulmonary artery systolic pressure. The estimated right ventricular systolic pressure is 31.3 mmHg.  3. The mitral valve is normal in structure. No evidence of mitral valve regurgitation. No evidence of mitral stenosis.  4. The aortic valve is tricuspid. There is moderate calcification of the aortic valve. Aortic valve regurgitation is trivial. Mild aortic valve stenosis. Aortic valve area, by VTI measures 1.53 cm. Aortic valve mean gradient measures 11.0 mmHg.  5. The inferior vena cava is normal in size with greater than 50% respiratory variability, suggesting right atrial pressure of 3 mmHg. FINDINGS  Left Ventricle: Left ventricular ejection fraction, by estimation, is 65 to 70%. The left ventricle has normal function. The left ventricle has no regional wall motion abnormalities. The left ventricular internal cavity size was normal in size. There is  mild concentric left ventricular hypertrophy. Left ventricular diastolic parameters are consistent with Grade I diastolic dysfunction (impaired relaxation). Right Ventricle: The right ventricular size is mildly enlarged. No increase in right ventricular wall thickness. Right ventricular systolic function is mildly reduced. There is normal pulmonary artery systolic pressure. The tricuspid regurgitant velocity  is 2.66 m/s, and with an assumed right atrial pressure of 3 mmHg, the estimated right ventricular systolic pressure is 31.3 mmHg. Left Atrium: Left atrial size was normal in size. Right Atrium:  Right atrial size was normal in size. Pericardium: There is no evidence of pericardial effusion. Mitral Valve: The mitral valve is normal in structure. There is mild calcification of the mitral valve leaflet(s). Mild mitral annular calcification. No evidence of mitral valve regurgitation. No evidence of mitral valve stenosis. Tricuspid Valve: The tricuspid valve is normal in structure. Tricuspid valve regurgitation is trivial. Aortic Valve: The aortic valve is tricuspid. There is moderate calcification of the aortic valve. Aortic valve regurgitation is trivial. Mild aortic stenosis is present. Aortic valve mean gradient measures 11.0 mmHg. Aortic valve peak gradient measures 19.4 mmHg. Aortic valve area, by VTI measures 1.53 cm. Pulmonic Valve: The pulmonic valve was normal in structure. Pulmonic valve regurgitation is trivial. Aorta: The aortic root is normal in size and structure. Venous: The inferior vena cava is normal in size with greater than 50% respiratory variability, suggesting right atrial pressure of 3 mmHg. IAS/Shunts: No  atrial level shunt detected by color flow Doppler.  LEFT VENTRICLE PLAX 2D LVIDd:         3.70 cm   Diastology LVIDs:         3.00 cm   LV e' medial:    3.92 cm/s LV PW:         1.30 cm   LV E/e' medial:  14.8 LV IVS:        1.30 cm   LV e' lateral:   2.72 cm/s LVOT diam:     1.90 cm   LV E/e' lateral: 21.4 LV SV:         56 LV SV Index:   30 LVOT Area:     2.84 cm  RIGHT VENTRICLE RV S prime:     8.59 cm/s TAPSE (M-mode): 2.0 cm LEFT ATRIUM         Index LA diam:    2.70 cm 1.46 cm/m  AORTIC VALVE AV Area (Vmax):    1.17 cm AV Area (Vmean):   1.29 cm AV Area (VTI):     1.53 cm AV Vmax:           220.00 cm/s AV Vmean:          144.400 cm/s AV VTI:            0.366 m AV Peak Grad:      19.4 mmHg AV Mean Grad:      11.0 mmHg LVOT Vmax:         90.50 cm/s LVOT Vmean:        65.800 cm/s LVOT VTI:          0.198 m LVOT/AV VTI ratio: 0.54  AORTA Ao Root diam: 3.20 cm MITRAL VALVE                 TRICUSPID VALVE MV Area (PHT): 2.54 cm     TR Peak grad:   28.3 mmHg MV Decel Time: 299 msec     TR Vmax:        266.00 cm/s MV E velocity: 58.10 cm/s MV A velocity: 100.00 cm/s  SHUNTS MV E/A ratio:  0.58         Systemic VTI:  0.20 m                             Systemic Diam: 1.90 cm Dalton McleanMD Electronically signed by Ezra Kanner Signature Date/Time: 09/15/2023/1:11:12 PM    Final    CT Head Wo Contrast Result Date: 09/13/2023 CLINICAL DATA:  Lightheaded with frequent falls. EXAM: CT HEAD WITHOUT CONTRAST TECHNIQUE: Contiguous axial images were obtained from the base of the skull through the vertex without intravenous contrast. RADIATION DOSE REDUCTION: This exam was performed according to the departmental dose-optimization program which includes automated exposure control, adjustment of the mA and/or kV according to patient size and/or use of iterative reconstruction technique. COMPARISON:  Jul 18, 2021 FINDINGS: Brain: There is generalized cerebral atrophy with widening of the extra-axial spaces and ventricular dilatation. There are areas of decreased attenuation within the white matter tracts of the supratentorial brain, consistent with microvascular disease changes. Vascular: No hyperdense vessel or unexpected calcification. Skull: Normal. Negative for fracture or focal lesion. Sinuses/Orbits: There is mild anterior left ethmoid sinus mucosal thickening. Postoperative changes consistent with prior right mastoidectomy are noted. Other: None. IMPRESSION: 1. 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 2. No acute intracranial abnormality.  3. Evidence of prior right mastoidectomy. Electronically Signed   By: Suzen Dials M.D.   On: 09/13/2023 18:35   DG Chest Port 1 View Result Date: 09/13/2023 CLINICAL DATA:  Shortness of breath EXAM: PORTABLE CHEST 1 VIEW COMPARISON:  08/30/2023 FINDINGS: No acute airspace disease or effusion. Stable  cardiomediastinal silhouette with aortic atherosclerosis. No pneumothorax. Probable calcified granuloma in the left lower lung. IMPRESSION: No active disease. Electronically Signed   By: Luke Bun M.D.   On: 09/13/2023 15:53     BASIC METABOLIC PANEL GLUCOSE 101 mg/dL 34-00 H Final  Fasting reference interval For someone without known diabetes, a glucose value between 100 and 125 mg/dL is consistent with prediabetes and should be confirmed with a follow-up test. UREA NITROGEN (BUN) 11 mg/dL 2-74 Final CREATININE 9.14 mg/dL 9.29-8.77 Final EGFR 87 mL/min/1.73 m2 > OR = 60 Final BUN/CREATININE RATIO SEE NOTE: (calc) 6-22 Final  Not Reported: BUN and Creatinine are within  reference range. SODIUM 130 mmol/L 135-146 L Final POTASSIUM 4.1 mmol/L 3.5-5.3 Final CHLORIDE 94 mmol/L 98-110 L Final CARBON DIOXIDE 25 mmol/L 20-32 Final CALCIUM  9.2 mg/dL 1.3-89.6 Fina   Assessment/Plan:   1. Parkinson's disease, unspecified whether dyskinesia present, unspecified whether manifestations fluctuate (HCC) (Primary) Cont with sinemet   2. Primary hypertension Bp running high  Increase amlodipine  t0 10 mg Monitor bp  Follow up with PCP in 2 weeks   3. Hyperlipidemia, unspecified hyperlipidemia type Cont with lipitor  4. Dizziness Cont with PT/OT Increase oral hydration  5. Diet-controlled diabetes mellitus (HCC) Diet controlled Avoid high carbohydrate foods  Other orders - tuberculin 5 UNIT/0.1ML injection; Inject 0.1 mLs into the skin once. Inject  mL one time a day. - amLODipine  (NORVASC ) 5 MG tablet; Take 2 tablets (10 mg total) by mouth daily.    Patient is being discharged with the following home health services:       Patient has been advised to f/u with their PCP in 1-2 weeks to bring them up to date on their rehab stay.  Social services at facility was responsible for arranging this appointment.     30 minTotal time spent for obtaining history,  performing a  medically appropriate examination and evaluation, reviewing the tests,   documenting clinical information in the electronic or other health record ,care and discharge coordination (not separately reported)

## 2023-10-01 ENCOUNTER — Non-Acute Institutional Stay (SKILLED_NURSING_FACILITY): Payer: Self-pay | Admitting: Sports Medicine

## 2023-10-01 ENCOUNTER — Encounter: Payer: Self-pay | Admitting: Sports Medicine

## 2023-10-01 DIAGNOSIS — R42 Dizziness and giddiness: Secondary | ICD-10-CM

## 2023-10-01 DIAGNOSIS — G20A1 Parkinson's disease without dyskinesia, without mention of fluctuations: Secondary | ICD-10-CM | POA: Diagnosis not present

## 2023-10-01 DIAGNOSIS — E871 Hypo-osmolality and hyponatremia: Secondary | ICD-10-CM | POA: Diagnosis not present

## 2023-10-01 DIAGNOSIS — I10 Essential (primary) hypertension: Secondary | ICD-10-CM

## 2023-10-01 NOTE — Progress Notes (Signed)
 Location:  [facility] Room Number: [id number] Place of Service:  SNF (31) Provider:  Sherlynn Albert, MD   Mast, Man X, NP  Patient Care Team: Mast, Man X, NP as PCP - General (Internal Medicine) Charmayne Molly, MD as Consulting Physician (Ophthalmology)  Extended Emergency Contact Information Primary Emergency Contact: [name],[name]  [place] Home Phone: [phone] Mobile Phone: [phone] Relation: Spouse Secondary Emergency Contact: [name],[name] Mobile Phone: [phone] Relation: Son Preferred language: English Interpreter needed? No  Code Status:  DNR Goals of care: Advanced Directive information    10/01/2023    1:53 PM  Advanced Directives  Does Patient Have a Medical Advance Directive? Yes  Type of Advance Directive Out of facility DNR (pink MOST or yellow form)  Does patient want to make changes to medical advance directive? No - Patient declined  Copy of Healthcare Power of Attorney in Chart? No - copy requested     Chief Complaint  Patient presents with   Acute Visit    Hyponatremia     HPI:  Pt is a 83 y.o. male with past medical history of hyponatremia, dizziness, hyperlipidemia, hypertension, diet-controlled diabetes is seen today for an acute visit for hyponatremia. Patient seen and examined in his room.  Patient is laying on his bed.  Seems pleasant and comfortable does not appear to be in distress. Bed is available at bedside. Patient had BMP drawn which showed sodium of 125 trending down from 130. Patient complains of chronic dizziness.  He describes of lightheadedness.  He is currently working with physical therapy and is able to transfer independently from bed to chair.  He is scheduled for discharge today back to his apartment but discharge postponed until Wednesday. Patient denies chest pain, shortness of breath, abdominal pain, nausea, vomiting, dysuria, hematuria, bloody, dark-colored  stools. Patient was his name, oriented to time and place. He can remember what he had for lunch this afternoon.    Past Medical History:  Diagnosis Date   Diabetes mellitus without complication (HCC)    High cholesterol    Hypertension    Parkinson disease (HCC)    Past Surgical History:  Procedure Laterality Date   CATARACT EXTRACTION     MASTOIDECTOMY     VASECTOMY      No Known Allergies  Allergies as of 10/01/2023   No Known Allergies      Medication List        Accurate as of October 01, 2023  1:58 PM. If you have any questions, ask your nurse or doctor.          amLODipine  5 MG tablet Commonly known as: NORVASC  Take 2 tablets (10 mg total) by mouth daily.   aspirin  EC 81 MG tablet Take 81 mg by mouth daily.   atorvastatin  20 MG tablet Commonly known as: LIPITOR Take 20 mg by mouth daily.   carbidopa -levodopa  25-100 MG tablet Commonly known as: SINEMET  IR Take 1-2 tablets by mouth See admin instructions. Take 1 tablet by mouth in the morning & at bedtime and 2 tablets at noontime   carbidopa -levodopa  25-100 MG tablet Commonly known as: SINEMET  IR Take 2 tablets by mouth daily.   CENTRUM MINIS MEN 50+ PO Take 1 tablet by mouth daily after supper.   polyethylene glycol 17 g packet Commonly known as: MIRALAX  / GLYCOLAX  Take 17 g by mouth daily as needed for mild constipation.   sodium chloride  1 g tablet Take 1 g by mouth 2 (two)  times daily.   tuberculin 5 UNIT/0.1ML injection Inject 0.1 mLs into the skin once. Inject  mL one time a day.        Review of Systems  Constitutional:  Negative for chills and fever.  Respiratory:  Negative for cough, shortness of breath and wheezing.   Cardiovascular:  Negative for chest pain, palpitations and leg swelling.  Gastrointestinal:  Negative for abdominal distention, abdominal pain, blood in stool, constipation, diarrhea, nausea and vomiting.  Genitourinary:  Negative for dysuria.  Neurological:   Positive for dizziness.    Immunization History  Administered Date(s) Administered   Influenza, High Dose Seasonal PF 11/16/2014, 11/27/2015, 12/16/2017, 12/05/2021, 12/08/2022   Moderna Covid-19 Fall Seasonal Vaccine 35yrs & older 12/08/2022   Moderna Covid-19 Vaccine Bivalent Booster 66yrs & up 12/05/2021   PFIZER Comirnaty(Gray Top)Covid-19 Tri-Sucrose Vaccine 12/29/2019, 08/16/2020   PFIZER(Purple Top)SARS-COV-2 Vaccination 04/17/2019, 05/08/2019   Pneumococcal Conjugate-13 11/13/2013   Pneumococcal Polysaccharide-23 07/07/2004, 07/11/2009   RSV,unspecified 04/09/2023   Td (Adult),5 Lf Tetanus Toxid, Preservative Free 08/07/1997   Tdap 11/26/2010   Zoster Recombinant(Shingrix) 12/04/2016, 05/13/2017   Zoster, Live 08/05/2011   Pertinent  Health Maintenance Due  Topic Date Due   INFLUENZA VACCINE  10/15/2023   HEMOGLOBIN A1C  01/27/2024   OPHTHALMOLOGY EXAM  07/25/2024   FOOT EXAM  08/11/2024      07/18/2021   10:57 AM 07/15/2023    2:57 PM  Fall Risk  Falls in the past year?  0  Was there an injury with Fall?  0  Fall Risk Category Calculator  0  (RETIRED) Patient Fall Risk Level Moderate fall risk    Patient at Risk for Falls Due to  No Fall Risks  Fall risk Follow up  Falls evaluation completed     Data saved with a previous flowsheet row definition   Functional Status Survey:    Vitals:   10/01/23 1355  BP: (!) 149/83  Pulse: 68  Resp: 18  Temp: (!) 97.4 F (36.3 C)  SpO2: 97%  Weight: 161 lb 9.6 oz (73.3 kg)  Height: 5' 8 (1.727 m)   Body mass index is 24.57 kg/m. Physical Exam Constitutional:      Appearance: Normal appearance.  HENT:     Head: Normocephalic and atraumatic.  Cardiovascular:     Rate and Rhythm: Normal rate and regular rhythm.     Pulses: Normal pulses.     Heart sounds: Normal heart sounds.  Pulmonary:     Effort: No respiratory distress.     Breath sounds: No stridor. No wheezing or rales.  Abdominal:     General: Bowel  sounds are normal. There is no distension.     Palpations: Abdomen is soft.     Tenderness: There is no abdominal tenderness. There is no guarding.  Musculoskeletal:        General: No swelling.  Neurological:     Mental Status: He is alert. Mental status is at baseline.     Motor: No weakness.     Labs reviewed: Recent Labs    09/15/23 0550 09/16/23 0525 09/17/23 0533  NA 128* 128* 130*  K 4.3 3.6 3.6  CL 98 99 96*  CO2 22 22 23   GLUCOSE 112* 110* 105*  BUN 12 10 13   CREATININE 0.86 0.77 0.88  CALCIUM  8.3* 8.4* 8.5*  MG 1.8 1.7 1.8  PHOS 2.9  --   --    Recent Labs    07/27/23 0809 08/30/23 1025 09/13/23 1538 09/14/23  0547  AST 22 26  --  19  ALT 36 11  --  7  ALKPHOS  --  64  --  68  BILITOT 1.2 1.4*  --  1.6*  PROT 6.9 6.6  --  5.7*  ALBUMIN  --  3.6 2.6* 3.2*   Recent Labs    07/27/23 0809 08/30/23 1025 09/13/23 1538 09/15/23 0550 09/16/23 0525 09/17/23 0533  WBC 9.8 9.6   < > 11.1* 11.4* 9.7  NEUTROABS 7,056 7.9*  --   --   --   --   HGB 16.3 15.5   < > 13.9 13.6 13.9  HCT 47.0 46.0   < > 39.9 38.6* 38.9*  MCV 95.3 97.3   < > 94.8 94.1 93.5  PLT 119* 179   < > 186 177 184   < > = values in this interval not displayed.   Lab Results  Component Value Date   TSH 2.059 09/14/2023   Lab Results  Component Value Date   HGBA1C 6.7 (H) 07/27/2023   Lab Results  Component Value Date   CHOL 153 07/27/2023   HDL 58 07/27/2023   LDLCALC 77 07/27/2023   TRIG 98 07/27/2023   CHOLHDL 2.6 07/27/2023    Significant Diagnostic Results in last 30 days:  ECHOCARDIOGRAM COMPLETE Result Date: 09/15/2023    ECHOCARDIOGRAM REPORT   Patient Name:   DELAINE CANTER Date of Exam: 09/15/2023 Medical Rec #:  [id number]       Height:       68.0 in Accession #:    [id number]      Weight:       159.2 lb Date of Birth:  13-Jul-1940        BSA:          1.855 m Patient Age:    83 years        BP:           148/82 mmHg Patient Gender: M               HR:           79 bpm. Exam  Location:  Inpatient Procedure: 2D Echo, Color Doppler and Cardiac Doppler (Both Spectral and Color            Flow Doppler were utilized during procedure). Indications:    Cardiomyopathy Unspecified I42.9  History:        Patient has no prior history of Echocardiogram examinations.                 Risk Factors:Diabetes and Hypertension.  Sonographer:    Tinnie Gosling RDCS Referring Phys: [id number] BURGESS BROCKS AMIN IMPRESSIONS  1. Left ventricular ejection fraction, by estimation, is 65 to 70%. The left ventricle has normal function. The left ventricle has no regional wall motion abnormalities. There is mild concentric left ventricular hypertrophy. Left ventricular diastolic parameters are consistent with Grade I diastolic dysfunction (impaired relaxation).  2. Right ventricular systolic function is mildly reduced. The right ventricular size is mildly enlarged. There is normal pulmonary artery systolic pressure. The estimated right ventricular systolic pressure is 31.3 mmHg.  3. The mitral valve is normal in structure. No evidence of mitral valve regurgitation. No evidence of mitral stenosis.  4. The aortic valve is tricuspid. There is moderate calcification of the aortic valve. Aortic valve regurgitation is trivial. Mild aortic valve stenosis. Aortic valve area, by VTI measures 1.53 cm. Aortic valve mean gradient measures 11.0 mmHg.  5. The  inferior vena cava is normal in size with greater than 50% respiratory variability, suggesting right atrial pressure of 3 mmHg. FINDINGS  Left Ventricle: Left ventricular ejection fraction, by estimation, is 65 to 70%. The left ventricle has normal function. The left ventricle has no regional wall motion abnormalities. The left ventricular internal cavity size was normal in size. There is  mild concentric left ventricular hypertrophy. Left ventricular diastolic parameters are consistent with Grade I diastolic dysfunction (impaired relaxation). Right Ventricle: The right ventricular  size is mildly enlarged. No increase in right ventricular wall thickness. Right ventricular systolic function is mildly reduced. There is normal pulmonary artery systolic pressure. The tricuspid regurgitant velocity  is 2.66 m/s, and with an assumed right atrial pressure of 3 mmHg, the estimated right ventricular systolic pressure is 31.3 mmHg. Left Atrium: Left atrial size was normal in size. Right Atrium: Right atrial size was normal in size. Pericardium: There is no evidence of pericardial effusion. Mitral Valve: The mitral valve is normal in structure. There is mild calcification of the mitral valve leaflet(s). Mild mitral annular calcification. No evidence of mitral valve regurgitation. No evidence of mitral valve stenosis. Tricuspid Valve: The tricuspid valve is normal in structure. Tricuspid valve regurgitation is trivial. Aortic Valve: The aortic valve is tricuspid. There is moderate calcification of the aortic valve. Aortic valve regurgitation is trivial. Mild aortic stenosis is present. Aortic valve mean gradient measures 11.0 mmHg. Aortic valve peak gradient measures 19.4 mmHg. Aortic valve area, by VTI measures 1.53 cm. Pulmonic Valve: The pulmonic valve was normal in structure. Pulmonic valve regurgitation is trivial. Aorta: The aortic root is normal in size and structure. Venous: The inferior vena cava is normal in size with greater than 50% respiratory variability, suggesting right atrial pressure of 3 mmHg. IAS/Shunts: No atrial level shunt detected by color flow Doppler.  LEFT VENTRICLE PLAX 2D LVIDd:         3.70 cm   Diastology LVIDs:         3.00 cm   LV e' medial:    3.92 cm/s LV PW:         1.30 cm   LV E/e' medial:  14.8 LV IVS:        1.30 cm   LV e' lateral:   2.72 cm/s LVOT diam:     1.90 cm   LV E/e' lateral: 21.4 LV SV:         56 LV SV Index:   30 LVOT Area:     2.84 cm  RIGHT VENTRICLE RV S prime:     8.59 cm/s TAPSE (M-mode): 2.0 cm LEFT ATRIUM         Index LA diam:    2.70 cm 1.46  cm/m  AORTIC VALVE AV Area (Vmax):    1.17 cm AV Area (Vmean):   1.29 cm AV Area (VTI):     1.53 cm AV Vmax:           220.00 cm/s AV Vmean:          144.400 cm/s AV VTI:            0.366 m AV Peak Grad:      19.4 mmHg AV Mean Grad:      11.0 mmHg LVOT Vmax:         90.50 cm/s LVOT Vmean:        65.800 cm/s LVOT VTI:          0.198 m LVOT/AV VTI ratio: 0.54  AORTA Ao Root diam: 3.20 cm MITRAL VALVE                TRICUSPID VALVE MV Area (PHT): 2.54 cm     TR Peak grad:   28.3 mmHg MV Decel Time: 299 msec     TR Vmax:        266.00 cm/s MV E velocity: 58.10 cm/s MV A velocity: 100.00 cm/s  SHUNTS MV E/A ratio:  0.58         Systemic VTI:  0.20 m                             Systemic Diam: 1.90 cm Dalton McleanMD Electronically signed by Ezra Kanner Signature Date/Time: 09/15/2023/1:11:12 PM    Final    CT Head Wo Contrast Result Date: 09/13/2023 CLINICAL DATA:  Lightheaded with frequent falls. EXAM: CT HEAD WITHOUT CONTRAST TECHNIQUE: Contiguous axial images were obtained from the base of the skull through the vertex without intravenous contrast. RADIATION DOSE REDUCTION: This exam was performed according to the departmental dose-optimization program which includes automated exposure control, adjustment of the mA and/or kV according to patient size and/or use of iterative reconstruction technique. COMPARISON:  Jul 18, 2021 FINDINGS: Brain: There is generalized cerebral atrophy with widening of the extra-axial spaces and ventricular dilatation. There are areas of decreased attenuation within the white matter tracts of the supratentorial brain, consistent with microvascular disease changes. Vascular: No hyperdense vessel or unexpected calcification. Skull: Normal. Negative for fracture or focal lesion. Sinuses/Orbits: There is mild anterior left ethmoid sinus mucosal thickening. Postoperative changes consistent with prior right mastoidectomy are noted. Other: None. IMPRESSION: 1. Generalized cerebral atrophy  with chronic white matter small vessel ischemic changes. 2. No acute intracranial abnormality. 3. Evidence of prior right mastoidectomy. Electronically Signed   By: Suzen Dials M.D.   On: 09/13/2023 18:35   DG Chest Port 1 View Result Date: 09/13/2023 CLINICAL DATA:  Shortness of breath EXAM: PORTABLE CHEST 1 VIEW COMPARISON:  08/30/2023 FINDINGS: No acute airspace disease or effusion. Stable cardiomediastinal silhouette with aortic atherosclerosis. No pneumothorax. Probable calcified granuloma in the left lower lung. IMPRESSION: No active disease. Electronically Signed   By: Luke Bun M.D.   On: 09/13/2023 15:53    Assessment/Plan  1. Hyponatremia (Primary) Sodium decreased to 125 from 138 Will start sodium tablets 1 g twice a day Will check BMP Tuesday and Monitor for change in mentation  2. Parkinson's disease, unspecified whether dyskinesia present, unspecified whether manifestations fluctuate (HCC) Continue with Sinemet  Continue  assistance with ADLs  3. Primary hypertension Amlodipine  increased to 10 mg recently Monitor blood pressure  4. Dizziness Encouraged with compression stockings Continue with PT/OT.  Other orders - sodium chloride  1 g tablet; Take 1 g by mouth 2 (two) times daily.    30 min Total time spent for obtaining history,  performing a medically appropriate examination and evaluation, reviewing the tests, documenting clinical information in the electronic or other health record,  ,care coordination (not separately reported)

## 2023-10-06 NOTE — Patient Instructions (Signed)
 Please visit your local pharmacy to receive your  vaccine, if you have not already received.

## 2023-10-07 ENCOUNTER — Encounter: Admitting: Nurse Practitioner

## 2023-10-08 NOTE — Addendum Note (Signed)
 Addended by: ADLINE PARISIAN X on: 10/08/2023 01:42 PM   Modules accepted: Level of Service

## 2023-10-08 NOTE — Progress Notes (Signed)
 This encounter was created in error - please disregard.

## 2023-10-21 LAB — BASIC METABOLIC PANEL WITH GFR
BUN: 11 (ref 4–21)
CO2: 25 — AB (ref 13–22)
Chloride: 101 (ref 99–108)
Creatinine: 0.7 (ref 0.6–1.3)
Glucose: 110
Potassium: 3.5 meq/L (ref 3.5–5.1)
Sodium: 133 — AB (ref 137–147)

## 2023-10-21 LAB — COMPREHENSIVE METABOLIC PANEL WITH GFR
Calcium: 9.1 (ref 8.7–10.7)
eGFR: 91

## 2023-11-02 ENCOUNTER — Encounter: Payer: Self-pay | Admitting: Nurse Practitioner

## 2023-11-02 ENCOUNTER — Non-Acute Institutional Stay (SKILLED_NURSING_FACILITY): Payer: Self-pay | Admitting: Nurse Practitioner

## 2023-11-02 DIAGNOSIS — G20A1 Parkinson's disease without dyskinesia, without mention of fluctuations: Secondary | ICD-10-CM | POA: Diagnosis not present

## 2023-11-02 DIAGNOSIS — E785 Hyperlipidemia, unspecified: Secondary | ICD-10-CM | POA: Diagnosis not present

## 2023-11-02 DIAGNOSIS — I10 Essential (primary) hypertension: Secondary | ICD-10-CM | POA: Diagnosis not present

## 2023-11-02 DIAGNOSIS — E871 Hypo-osmolality and hyponatremia: Secondary | ICD-10-CM

## 2023-11-02 NOTE — Assessment & Plan Note (Signed)
 Blood pressure is controlled, on Amlodipine , off  Valsartan, Bun/creat 11/0.7 8/7/25ed,

## 2023-11-02 NOTE — Progress Notes (Unsigned)
 Location:  [facility] Room Number: [id number] Place of Service:  SNF (31) Provider:  Indiana Gamero X, NP  Patient Care Team: Salimah Martinovich X, NP as PCP - General (Internal Medicine) Charmayne Molly, MD as Consulting Physician (Ophthalmology)  Extended Emergency Contact Information Primary Emergency Contact: [name],[name]  [place] Home Phone: [phone] Mobile Phone: [phone] Relation: Spouse Secondary Emergency Contact: [name],[name] Mobile Phone: [phone] Relation: Son Preferred language: English Interpreter needed? No  Code Status:  DNR Goals of care: Advanced Directive information    10/01/2023    1:53 PM  Advanced Directives  Does Patient Have a Medical Advance Directive? Yes  Type of Advance Directive Out of facility DNR (pink MOST or yellow form)  Does patient want to make changes to medical advance directive? No - Patient declined  Copy of Healthcare Power of Attorney in Chart? No - copy requested     Chief Complaint  Patient presents with   Medical Management of Chronic Issues    Routine Visit, needs to discuss tetanus vaccine     HPI:  Pt is a 83 y.o. male seen today for managing chronic medical conditions  Hospitalized 09/13/2023 to 09/17/2023 for status post fall, he was a found to have hypotensive, losartan which was discontinued, amlodipine  was added.  CT head and chest x-ray were negative.              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 CT 09/13/23 no acute intracranial process, s/p R mastoidectomy.              Parkinson's disease, followed by Neurology, taking Sinemet .              Anxiety, TSH 4.11, Vit B 872, Vit D 37 07/27/23             HLD,  on ASA,  Atorvastatin , LDL 77 07/27/23             Constipation, every 3-4 days BM, hard, prn MiraLax .             Hyponatremia, Na 133 10/21/23 at his baseline, on salt tab     Past Medical History:  Diagnosis Date   Diabetes mellitus without complication (HCC)    High cholesterol    Hypertension    Parkinson disease (HCC)    Past Surgical History:  Procedure Laterality Date   CATARACT EXTRACTION     MASTOIDECTOMY     VASECTOMY      No Known Allergies  Outpatient Encounter Medications as of 11/02/2023  Medication Sig   amLODipine  (NORVASC ) 5 MG tablet Take 2 tablets (10 mg total) by mouth daily.   aspirin  EC 81 MG tablet Take 81 mg by mouth daily.   atorvastatin  (LIPITOR) 20 MG tablet Take 20 mg by mouth daily.   carbidopa -levodopa  (SINEMET  IR) 25-100 MG tablet Take 1-2 tablets by mouth See admin instructions. Take 1 tablet by mouth in the morning & at bedtime and 2 tablets at noontime   carbidopa -levodopa  (SINEMET  IR) 25-100 MG tablet  Take 2 tablets by mouth daily.   Multiple Vitamins-Minerals (CENTRUM MINIS MEN 50+ PO) Take 1 tablet by mouth daily after supper.   sodium chloride  1 g tablet Take 1 g by mouth 2 (two) times daily.   polyethylene glycol (MIRALAX  / GLYCOLAX ) 17 g packet Take 17 g by mouth daily as needed for mild constipation.   [DISCONTINUED] tuberculin 5 UNIT/0.1ML injection Inject 0.1 mLs into the skin once. Inject  mL one time a day. (Patient not taking: Reported on 11/02/2023)   No facility-administered encounter medications on file as of 11/02/2023.    Review of Systems  Constitutional:  Negative for appetite change and fatigue.  HENT:  Negative for congestion and trouble swallowing.   Eyes:  Negative for visual disturbance.  Respiratory:  Negative for cough and shortness of breath.   Cardiovascular:  Negative for leg swelling.  Gastrointestinal:  Positive for constipation. Negative for abdominal pain.  Genitourinary:  Negative for frequency.   Musculoskeletal:  Positive for arthralgias and gait problem.  Neurological:  Positive for light-headedness. Negative for weakness.  Psychiatric/Behavioral:  Negative for behavioral problems and sleep disturbance. The patient is not nervous/anxious.     Immunization History  Administered Date(s) Administered   Influenza, High Dose Seasonal PF 11/16/2014, 11/27/2015, 12/16/2017, 12/05/2021, 12/08/2022   Moderna Covid-19 Fall Seasonal Vaccine 55yrs & older 12/08/2022   Moderna Covid-19 Vaccine Bivalent Booster 41yrs & up 12/05/2021   PFIZER Comirnaty(Gray Top)Covid-19 Tri-Sucrose Vaccine 12/29/2019, 08/16/2020   PFIZER(Purple Top)SARS-COV-2 Vaccination 04/17/2019, 05/08/2019   Pneumococcal Conjugate-13 11/13/2013   Pneumococcal Polysaccharide-23 07/07/2004, 07/11/2009   RSV,unspecified 04/09/2023   Td (Adult),5 Lf Tetanus Toxid, Preservative Free 08/07/1997   Tdap 11/26/2010   Zoster Recombinant(Shingrix) 12/04/2016, 05/13/2017   Zoster, Live 08/05/2011   Pertinent  Health Maintenance Due  Topic Date Due   INFLUENZA VACCINE  11/15/2023 (Originally 10/15/2023)   HEMOGLOBIN A1C  01/27/2024   OPHTHALMOLOGY EXAM  07/25/2024   FOOT EXAM  08/11/2024      07/18/2021   10:57 AM 07/15/2023    2:57 PM  Fall Risk  Falls in the past year?  0  Was there an injury with Fall?  0  Fall Risk Category Calculator  0  (RETIRED) Patient Fall Risk Level Moderate fall risk    Patient at Risk for Falls Due to  No Fall Risks  Fall risk Follow up  Falls evaluation completed     Data saved with a previous flowsheet row definition   Functional Status Survey:    Vitals:   11/02/23 1049  BP: 130/73  Pulse: 62  Weight: 158 lb 1.6 oz (71.7 kg)  Height: 5' 8 (1.727 m)   Body mass index is 24.04 kg/m. Physical Exam Constitutional:      Appearance: Normal appearance.  Cardiovascular:     Rate and Rhythm: Normal rate and regular rhythm.     Pulses: Normal pulses.     Heart sounds: Normal heart  sounds.  Pulmonary:     Effort: Pulmonary effort is normal.     Breath sounds: No rales.  Abdominal:     General: Bowel sounds are normal.     Palpations: Abdomen is soft.     Tenderness: There is no abdominal tenderness.  Musculoskeletal:        General: No swelling.  Neurological:     Mental Status: He is alert. Mental status is at baseline.     Motor: No weakness.     Labs reviewed: Recent Labs    09/15/23  0550 09/16/23 0525 09/17/23 0533 10/21/23 0000  NA 128* 128* 130* 133*  K 4.3 3.6 3.6 3.5  CL 98 99 96* 101  CO2 22 22 23  25*  GLUCOSE 112* 110* 105*  --   BUN 12 10 13 11   CREATININE 0.86 0.77 0.88 0.7  CALCIUM  8.3* 8.4* 8.5* 9.1  MG 1.8 1.7 1.8  --   PHOS 2.9  --   --   --    Recent Labs    07/27/23 0809 08/30/23 1025 09/13/23 1538 09/14/23 0547  AST 22 26  --  19  ALT 36 11  --  7  ALKPHOS  --  64  --  68  BILITOT 1.2 1.4*  --  1.6*  PROT 6.9 6.6  --  5.7*  ALBUMIN  --  3.6 2.6* 3.2*   Recent Labs    07/27/23 0809 08/30/23 1025 09/13/23 1538 09/15/23 0550 09/16/23 0525 09/17/23 0533  WBC 9.8 9.6   < > 11.1* 11.4* 9.7  NEUTROABS 7,056 7.9*  --   --   --   --   HGB 16.3 15.5   < > 13.9 13.6 13.9  HCT 47.0 46.0   < > 39.9 38.6* 38.9*  MCV 95.3 97.3   < > 94.8 94.1 93.5  PLT 119* 179   < > 186 177 184   < > = values in this interval not displayed.   Lab Results  Component Value Date   TSH 2.059 09/14/2023   Lab Results  Component Value Date   HGBA1C 6.7 (H) 07/27/2023   Lab Results  Component Value Date   CHOL 153 07/27/2023   HDL 58 07/27/2023   LDLCALC 77 07/27/2023   TRIG 98 07/27/2023   CHOLHDL 2.6 07/27/2023    Significant Diagnostic Results in last 30 days:  No results found.  Assessment/Plan HTN (hypertension) Blood pressure is controlled, on Amlodipine , off  Valsartan, Bun/creat 11/0.7 8/7/25ed,   Parkinson's disease (HCC)  followed by Neurology, taking Sinemet .   HLD (hyperlipidemia) on ASA,  Atorvastatin , LDL 77  07/27/23  Hyponatremia Na 133 10/21/23 at his baseline, on salt tab    Family/ staff Communication: Plan of care reviewed with the patient and charge nurse  Labs/tests ordered: None

## 2023-11-02 NOTE — Assessment & Plan Note (Signed)
 Na 133 10/21/23 at his baseline, on salt tab

## 2023-11-02 NOTE — Assessment & Plan Note (Signed)
 diet controlled, Hgb A1c 6.7 07/27/23

## 2023-11-02 NOTE — Assessment & Plan Note (Signed)
 followed by Neurology, taking Sinemet.

## 2023-11-02 NOTE — Assessment & Plan Note (Signed)
 on ASA,  Atorvastatin , LDL 77 07/27/23

## 2023-12-08 ENCOUNTER — Non-Acute Institutional Stay (SKILLED_NURSING_FACILITY): Payer: Self-pay | Admitting: Nurse Practitioner

## 2023-12-08 ENCOUNTER — Encounter: Payer: Self-pay | Admitting: Nurse Practitioner

## 2023-12-08 DIAGNOSIS — I951 Orthostatic hypotension: Secondary | ICD-10-CM

## 2023-12-08 DIAGNOSIS — G20A1 Parkinson's disease without dyskinesia, without mention of fluctuations: Secondary | ICD-10-CM

## 2023-12-08 DIAGNOSIS — Z87898 Personal history of other specified conditions: Secondary | ICD-10-CM | POA: Diagnosis not present

## 2023-12-08 DIAGNOSIS — I10 Essential (primary) hypertension: Secondary | ICD-10-CM | POA: Diagnosis not present

## 2023-12-08 DIAGNOSIS — E1165 Type 2 diabetes mellitus with hyperglycemia: Secondary | ICD-10-CM

## 2023-12-08 DIAGNOSIS — K5901 Slow transit constipation: Secondary | ICD-10-CM

## 2023-12-08 DIAGNOSIS — F419 Anxiety disorder, unspecified: Secondary | ICD-10-CM

## 2023-12-08 DIAGNOSIS — E871 Hypo-osmolality and hyponatremia: Secondary | ICD-10-CM

## 2023-12-08 DIAGNOSIS — E785 Hyperlipidemia, unspecified: Secondary | ICD-10-CM

## 2023-12-08 NOTE — Assessment & Plan Note (Signed)
 evaluated by Cardiology, unremarkable workups. CT 09/13/23 no acute intracranial process, s/p R mastoidectomy.

## 2023-12-08 NOTE — Progress Notes (Signed)
 Location:   [facility] Room Number: [id number] Place of Service:  SNF (31) Provider: Larwance Mariena Meares NP  Jenness Stemler X, NP  Patient Care Team: Takeem Krotzer X, NP as PCP - General (Internal Medicine) Charmayne Molly, MD as Consulting Physician (Ophthalmology)  Extended Emergency Contact Information Primary Emergency Contact: [name],[name]  [place] Home Phone: [phone] Mobile Phone: [phone] Relation: Spouse Secondary Emergency Contact: [name],[name] Mobile Phone: [phone] Relation: Son Preferred language: English Interpreter needed? No  Code Status:  DNR Goals of care: Advanced Directive information    10/01/2023    1:53 PM  Advanced Directives  Does Patient Have a Medical Advance Directive? Yes  Type of Advance Directive Out of facility DNR (pink MOST or yellow form)  Does patient want to make changes to medical advance directive? No - Patient declined  Copy of Healthcare Power of Attorney in Chart? No - copy requested     Chief Complaint  Patient presents with   Medical Management of Chronic Issues    HPI:  Pt is a 83 y.o. male seen today for medical management of chronic diseases.    Hospitalized 09/13/2023 to 09/17/2023 for status post fall, he was a found to have hypotensive, losartan which was discontinued, amlodipine  was added.  CT head and chest x-ray were negative.              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 CT 09/13/23 no acute intracranial process, s/p R mastoidectomy.              Parkinson's disease, followed by Neurology, taking Sinemet .              Anxiety, better in SNF [facility], TSH 4.11, Vit B 872, Vit D 37 07/27/23             HLD, on ASA,  Atorvastatin , LDL  77 07/27/23             Constipation, every 3-4 days BM, hard, MiraLax  is not adequate, agreed to Senokot S at bedtime.              Hyponatremia, Na 133 10/21/23 at his baseline, on salt tab    Past Medical History:  Diagnosis Date   Diabetes mellitus without complication (HCC)    High cholesterol    Hypertension    Parkinson disease (HCC)    Past Surgical History:  Procedure Laterality Date   CATARACT EXTRACTION     MASTOIDECTOMY     VASECTOMY      No Known Allergies  Allergies as of 12/08/2023   No Known Allergies      Medication List        Accurate as of December 08, 2023 11:59 PM. If you have any questions, ask your nurse or doctor.          amLODipine  5 MG tablet Commonly known as: NORVASC  Take 2 tablets (10 mg total) by mouth daily.   aspirin  EC 81 MG tablet Take 81 mg by mouth daily.   atorvastatin   20 MG tablet Commonly known as: LIPITOR Take 20 mg by mouth daily.   carbidopa -levodopa  25-100 MG tablet Commonly known as: SINEMET  IR Take 1-2 tablets by mouth See admin instructions. Take 1 tablet by mouth in the morning & at bedtime and 2 tablets at noontime   carbidopa -levodopa  25-100 MG tablet Commonly known as: SINEMET  IR Take 2 tablets by mouth daily.   CENTRUM MINIS MEN 50+ PO Take 1 tablet by mouth daily after supper.   polyethylene glycol 17 g packet Commonly known as: MIRALAX  / GLYCOLAX  Take 17 g by mouth daily as needed for mild constipation.   sodium chloride  1 g tablet Take 1 g by mouth 2 (two) times daily.        Review of Systems  Constitutional:  Negative for appetite change and fatigue.  HENT:  Negative for congestion and trouble swallowing.   Eyes:  Negative for visual disturbance.  Respiratory:  Negative for cough and shortness of breath.   Cardiovascular:  Negative for leg swelling.  Gastrointestinal:  Positive for constipation. Negative for abdominal pain.  Genitourinary:  Negative for frequency.  Musculoskeletal:   Positive for arthralgias and gait problem.  Neurological:  Positive for light-headedness. Negative for weakness.  Psychiatric/Behavioral:  Negative for behavioral problems and sleep disturbance. The patient is not nervous/anxious.     Immunization History  Administered Date(s) Administered   INFLUENZA, HIGH DOSE SEASONAL PF 11/16/2014, 11/27/2015, 12/16/2017, 12/05/2021, 12/08/2022   Moderna Covid-19 Fall Seasonal Vaccine 36yrs & older 12/08/2022   Moderna Covid-19 Vaccine Bivalent Booster 71yrs & up 12/05/2021   PFIZER Comirnaty(Gray Top)Covid-19 Tri-Sucrose Vaccine 12/29/2019, 08/16/2020   PFIZER(Purple Top)SARS-COV-2 Vaccination 04/17/2019, 05/08/2019   Pneumococcal Conjugate-13 11/13/2013   Pneumococcal Polysaccharide-23 07/07/2004, 07/11/2009   RSV,unspecified 04/09/2023   Td (Adult),5 Lf Tetanus Toxid, Preservative Free 08/07/1997   Tdap 11/26/2010   Zoster Recombinant(Shingrix) 12/04/2016, 05/13/2017   Zoster, Live 08/05/2011   Pertinent  Health Maintenance Due  Topic Date Due   Influenza Vaccine  10/15/2023   HEMOGLOBIN A1C  01/27/2024   OPHTHALMOLOGY EXAM  07/25/2024   FOOT EXAM  08/11/2024      07/18/2021   10:57 AM 07/15/2023    2:57 PM  Fall Risk  Falls in the past year?  0  Was there an injury with Fall?  0  Fall Risk Category Calculator  0  (RETIRED) Patient Fall Risk Level Moderate fall risk    Patient at Risk for Falls Due to  No Fall Risks  Fall risk Follow up  Falls evaluation completed     Data saved with a previous flowsheet row definition   Functional Status Survey:    Vitals:   12/08/23 1401  BP: 132/60  Pulse: 71  Resp: 18  Temp: (!) 97.1 F (36.2 C)  SpO2: 97%  Weight: 160 lb 3.2 oz (72.7 kg)   Body mass index is 24.36 kg/m. Physical Exam Constitutional:      Appearance: Normal appearance.  Cardiovascular:     Rate and Rhythm: Normal rate and regular rhythm.     Heart sounds: Murmur heard.  Pulmonary:     Effort: Pulmonary effort is  normal.     Breath sounds: No rales.  Abdominal:     General: Bowel sounds are normal.     Palpations: Abdomen is soft.     Tenderness: There is no abdominal tenderness.  Musculoskeletal:        General: No swelling.  Neurological:     Mental Status: He is alert. Mental  status is at baseline.     Motor: No weakness.     Labs reviewed: Recent Labs    09/15/23 0550 09/16/23 0525 09/17/23 0533 10/21/23 0000  NA 128* 128* 130* 133*  K 4.3 3.6 3.6 3.5  CL 98 99 96* 101  CO2 22 22 23  25*  GLUCOSE 112* 110* 105*  --   BUN 12 10 13 11   CREATININE 0.86 0.77 0.88 0.7  CALCIUM  8.3* 8.4* 8.5* 9.1  MG 1.8 1.7 1.8  --   PHOS 2.9  --   --   --    Recent Labs    07/27/23 0809 08/30/23 1025 09/13/23 1538 09/14/23 0547  AST 22 26  --  19  ALT 36 11  --  7  ALKPHOS  --  64  --  68  BILITOT 1.2 1.4*  --  1.6*  PROT 6.9 6.6  --  5.7*  ALBUMIN  --  3.6 2.6* 3.2*   Recent Labs    07/27/23 0809 08/30/23 1025 09/13/23 1538 09/15/23 0550 09/16/23 0525 09/17/23 0533  WBC 9.8 9.6   < > 11.1* 11.4* 9.7  NEUTROABS 7,056 7.9*  --   --   --   --   HGB 16.3 15.5   < > 13.9 13.6 13.9  HCT 47.0 46.0   < > 39.9 38.6* 38.9*  MCV 95.3 97.3   < > 94.8 94.1 93.5  PLT 119* 179   < > 186 177 184   < > = values in this interval not displayed.   Lab Results  Component Value Date   TSH 2.059 09/14/2023   Lab Results  Component Value Date   HGBA1C 6.7 (H) 07/27/2023   Lab Results  Component Value Date   CHOL 153 07/27/2023   HDL 58 07/27/2023   LDLCALC 77 07/27/2023   TRIG 98 07/27/2023   CHOLHDL 2.6 07/27/2023    Significant Diagnostic Results in last 30 days:  No results found.  Assessment/Plan  HTN (hypertension) Blood pressure is controlled, continue amlodipine , Bun/creat 11/0.7 10/21/23  Type 2 diabetes mellitus (HCC) diet controlled, Hgb A1c 6.7 07/27/23  Orthostatic hypotension Change of position slowly  History of syncope  evaluated by Cardiology, unremarkable  workups. CT 09/13/23 no acute intracranial process, s/p R mastoidectomy.   Parkinson's disease (HCC)  followed by Neurology, taking Sinemet .  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   Hyponatremia Maintained at his baseline, Na 133 10/21/23 at his baseline, on salt tab   Family/ staff Communication: Plan of care reviewed with the patient and charge nurse  Labs/tests ordered: None

## 2023-12-08 NOTE — Assessment & Plan Note (Signed)
 followed by Neurology, taking Sinemet .  No apparent tremor in hands, able to feed himself, no difficulty swallowing

## 2023-12-08 NOTE — Assessment & Plan Note (Signed)
 Change of position slowly

## 2023-12-08 NOTE — Assessment & Plan Note (Signed)
 better in SNF [facility], TSH 4.11, Vit B 872, Vit D 37 07/27/23

## 2023-12-08 NOTE — Assessment & Plan Note (Signed)
 diet controlled, Hgb A1c 6.7 07/27/23

## 2023-12-08 NOTE — Assessment & Plan Note (Signed)
 on ASA,  Atorvastatin , LDL 77 07/27/23

## 2023-12-08 NOTE — Assessment & Plan Note (Signed)
 Blood pressure is controlled, continue amlodipine , Bun/creat 11/0.7 10/21/23

## 2023-12-08 NOTE — Assessment & Plan Note (Addendum)
 every 3-4 days BM, hard, MiraLax  is not adequate, agreed to Senokot S at bedtime.

## 2023-12-08 NOTE — Assessment & Plan Note (Signed)
 Maintained at his baseline, Na 133 10/21/23 at his baseline, on salt tab

## 2023-12-17 ENCOUNTER — Non-Acute Institutional Stay (SKILLED_NURSING_FACILITY): Payer: Self-pay | Admitting: Sports Medicine

## 2023-12-17 ENCOUNTER — Encounter: Payer: Self-pay | Admitting: Sports Medicine

## 2023-12-17 DIAGNOSIS — G20A1 Parkinson's disease without dyskinesia, without mention of fluctuations: Secondary | ICD-10-CM | POA: Diagnosis not present

## 2023-12-17 DIAGNOSIS — I10 Essential (primary) hypertension: Secondary | ICD-10-CM

## 2023-12-17 DIAGNOSIS — E871 Hypo-osmolality and hyponatremia: Secondary | ICD-10-CM

## 2023-12-17 DIAGNOSIS — K5900 Constipation, unspecified: Secondary | ICD-10-CM

## 2023-12-17 DIAGNOSIS — E785 Hyperlipidemia, unspecified: Secondary | ICD-10-CM

## 2023-12-17 NOTE — Progress Notes (Unsigned)
 Location:  [facility] Room Number: [id number] Place of Service:  SNF (31) Provider:  Sherlynn Albert, MD   Mast, Man X, NP  Patient Care Team: Mast, Man X, NP as PCP - General (Internal Medicine) Charmayne Molly, MD as Consulting Physician (Ophthalmology)  Extended Emergency Contact Information Primary Emergency Contact: [name],[name]  [place] Home Phone: [phone] Mobile Phone: [phone] Relation: Spouse Secondary Emergency Contact: [name],[name] Mobile Phone: [phone] Relation: Son Preferred language: English Interpreter needed? No  Code Status:  DNR Goals of care: Advanced Directive information    12/17/2023   12:18 PM  Advanced Directives  Does Patient Have a Medical Advance Directive? Yes  Type of Advance Directive Out of facility DNR (pink MOST or yellow form)  Does patient want to make changes to medical advance directive? No - Patient declined     Chief Complaint  Patient presents with  . Medical Management of Chronic Issues    Routine Visit     HPI:  Pt is a 83 y.o. male with PMH of DM, HTN, Parkinson's disease, HLD, Hyponatremia is seen today for medical management of chronic diseases.    Pt seen and examined in his room He is playing games on his Ipad. Seems pleasant and comfortable and does not appear to be in distress Pt denies chest pain, palpitations, SOB, abdominal pain, nausea, vomiting, dysuria, hematuria, bloody or dark stools.    Past Medical History:  Diagnosis Date  . Diabetes mellitus without complication (HCC)   . High cholesterol   . Hypertension   . Parkinson disease [facility])    Past Surgical History:  Procedure Laterality Date  . CATARACT EXTRACTION    . MASTOIDECTOMY    . VASECTOMY      No Known Allergies  Allergies as of 12/17/2023   No Known Allergies      Medication List        Accurate as of December 17, 2023 12:20 PM. If you have any questions, ask your nurse  or doctor.          amLODipine  5 MG tablet Commonly known as: NORVASC  Take 2 tablets (10 mg total) by mouth daily.   aspirin  EC 81 MG tablet Take 81 mg by mouth daily.   atorvastatin  20 MG tablet Commonly known as: LIPITOR Take 20 mg by mouth daily.   carbidopa -levodopa  25-100 MG tablet Commonly known as: SINEMET  IR Take 1-2 tablets by mouth See admin instructions. Take 1 tablet by mouth in the morning & at bedtime and 2 tablets at noontime   carbidopa -levodopa  25-100 MG tablet Commonly known as: SINEMET  IR Take 2 tablets by mouth daily.   CENTRUM MINIS MEN 50+ PO Take 1 tablet by mouth daily after supper.   polyethylene glycol 17 g packet Commonly known as: MIRALAX  / GLYCOLAX  Take 17 g by mouth daily as needed for mild constipation.   sennosides-docusate sodium 8.6-50 MG tablet Commonly known as: SENOKOT-S Take 1 tablet by mouth daily. Give 1 tablet by mouth at bedtime related to CONSTIPATION, UNSPECIFIED   sodium chloride  1 g tablet Take 1 g by mouth 2 (two) times daily.        Review of Systems  Constitutional:  Negative for fever.  Respiratory:  Negative for cough and shortness of breath.   Cardiovascular:  Negative for chest pain and leg swelling.  Gastrointestinal:  Negative for abdominal pain, diarrhea, nausea and vomiting.  Genitourinary:  Negative for dysuria.  Neurological:  Negative for dizziness.  Immunization History  Administered Date(s) Administered  . INFLUENZA, HIGH DOSE SEASONAL PF 11/16/2014, 11/27/2015, 12/16/2017, 12/05/2021, 12/08/2022  . Moderna Covid-19 Fall Seasonal Vaccine 17yrs & older 12/08/2022  . Moderna Covid-19 Vaccine Bivalent Booster 15yrs & up 12/05/2021  . PFIZER Comirnaty(Gray Top)Covid-19 Tri-Sucrose Vaccine 12/29/2019, 08/16/2020  . PFIZER(Purple Top)SARS-COV-2 Vaccination 04/17/2019, 05/08/2019  . Pneumococcal Conjugate-13 11/13/2013  . Pneumococcal Polysaccharide-23 07/07/2004, 07/11/2009  . RSV,unspecified  04/09/2023  . Td (Adult),5 Lf Tetanus Toxid, Preservative Free 08/07/1997  . Tdap 11/26/2010  . Zoster Recombinant(Shingrix) 12/04/2016, 05/13/2017  . Zoster, Live 08/05/2011   Pertinent  Health Maintenance Due  Topic Date Due  . Influenza Vaccine  10/15/2023  . HEMOGLOBIN A1C  01/27/2024  . OPHTHALMOLOGY EXAM  07/25/2024  . FOOT EXAM  08/11/2024      07/18/2021   10:57 AM 07/15/2023    2:57 PM  Fall Risk  Falls in the past year?  0  Was there an injury with Fall?  0  Fall Risk Category Calculator  0  (RETIRED) Patient Fall Risk Level Moderate fall risk    Patient at Risk for Falls Due to  No Fall Risks  Fall risk Follow up  Falls evaluation completed     Data saved with a previous flowsheet row definition   Functional Status Survey:    Vitals:   12/17/23 1217  BP: 138/74  Pulse: 76  Resp: (!) 8  Temp: (!) 97.1 F (36.2 C)  SpO2: 97%  Weight: 162 lb 3.2 oz (73.6 kg)  Height: 5' 8 (1.727 m)   Body mass index is 24.66 kg/m. Physical Exam Constitutional:      Appearance: Normal appearance.  HENT:     Head: Normocephalic and atraumatic.  Cardiovascular:     Rate and Rhythm: Normal rate and regular rhythm.     Pulses: Normal pulses.     Heart sounds: Normal heart sounds.  Pulmonary:     Effort: No respiratory distress.     Breath sounds: No stridor. No wheezing or rales.  Abdominal:     General: Bowel sounds are normal. There is no distension.     Palpations: Abdomen is soft.     Tenderness: There is no abdominal tenderness. There is no guarding.  Musculoskeletal:        General: No swelling.  Neurological:     Mental Status: He is alert. Mental status is at baseline.     Motor: No weakness.     Labs reviewed: Recent Labs    09/15/23 0550 09/16/23 0525 09/17/23 0533 10/21/23 0000  NA 128* 128* 130* 133*  K 4.3 3.6 3.6 3.5  CL 98 99 96* 101  CO2 22 22 23  25*  GLUCOSE 112* 110* 105*  --   BUN 12 10 13 11   CREATININE 0.86 0.77 0.88 0.7  CALCIUM   8.3* 8.4* 8.5* 9.1  MG 1.8 1.7 1.8  --   PHOS 2.9  --   --   --    Recent Labs    07/27/23 0809 08/30/23 1025 09/13/23 1538 09/14/23 0547  AST 22 26  --  19  ALT 36 11  --  7  ALKPHOS  --  64  --  68  BILITOT 1.2 1.4*  --  1.6*  PROT 6.9 6.6  --  5.7*  ALBUMIN  --  3.6 2.6* 3.2*   Recent Labs    07/27/23 0809 08/30/23 1025 09/13/23 1538 09/15/23 0550 09/16/23 0525 09/17/23 0533  WBC 9.8 9.6   < >  11.1* 11.4* 9.7  NEUTROABS 7,056 7.9*  --   --   --   --   HGB 16.3 15.5   < > 13.9 13.6 13.9  HCT 47.0 46.0   < > 39.9 38.6* 38.9*  MCV 95.3 97.3   < > 94.8 94.1 93.5  PLT 119* 179   < > 186 177 184   < > = values in this interval not displayed.   Lab Results  Component Value Date   TSH 2.059 09/14/2023   Lab Results  Component Value Date   HGBA1C 6.7 (H) 07/27/2023   Lab Results  Component Value Date   CHOL 153 07/27/2023   HDL 58 07/27/2023   LDLCALC 77 07/27/2023   TRIG 98 07/27/2023   CHOLHDL 2.6 07/27/2023    Significant Diagnostic Results in last 30 days:  No results found.  Assessment/Plan     Family/ staff Communication:   Labs/tests ordered:

## 2023-12-21 ENCOUNTER — Encounter: Payer: Self-pay | Admitting: Sports Medicine

## 2024-01-20 LAB — BASIC METABOLIC PANEL WITH GFR
BUN: 10 (ref 4–21)
CO2: 25 — AB (ref 13–22)
Chloride: 100 (ref 99–108)
Creatinine: 0.8 (ref 0.6–1.3)
Glucose: 122
Potassium: 3.7 meq/L (ref 3.5–5.1)
Sodium: 135 — AB (ref 137–147)

## 2024-01-20 LAB — COMPREHENSIVE METABOLIC PANEL WITH GFR
Calcium: 9.4 (ref 8.7–10.7)
eGFR: 87

## 2024-01-25 LAB — COMPREHENSIVE METABOLIC PANEL WITH GFR
Calcium: 9 (ref 8.7–10.7)
eGFR: 87

## 2024-01-25 LAB — BASIC METABOLIC PANEL WITH GFR
BUN: 12 (ref 4–21)
CO2: 27 — AB (ref 13–22)
Chloride: 102 (ref 99–108)
Creatinine: 0.8 (ref 0.6–1.3)
Glucose: 123
Potassium: 3.9 meq/L (ref 3.5–5.1)
Sodium: 136 — AB (ref 137–147)

## 2024-01-27 ENCOUNTER — Encounter: Payer: Self-pay | Admitting: Nurse Practitioner

## 2024-01-27 ENCOUNTER — Non-Acute Institutional Stay (SKILLED_NURSING_FACILITY): Payer: Self-pay | Admitting: Nurse Practitioner

## 2024-01-27 DIAGNOSIS — I951 Orthostatic hypotension: Secondary | ICD-10-CM

## 2024-01-27 DIAGNOSIS — Z87898 Personal history of other specified conditions: Secondary | ICD-10-CM

## 2024-01-27 DIAGNOSIS — G20A1 Parkinson's disease without dyskinesia, without mention of fluctuations: Secondary | ICD-10-CM

## 2024-01-27 DIAGNOSIS — E871 Hypo-osmolality and hyponatremia: Secondary | ICD-10-CM

## 2024-01-27 DIAGNOSIS — K5901 Slow transit constipation: Secondary | ICD-10-CM

## 2024-01-27 DIAGNOSIS — I10 Essential (primary) hypertension: Secondary | ICD-10-CM | POA: Diagnosis not present

## 2024-01-27 DIAGNOSIS — F419 Anxiety disorder, unspecified: Secondary | ICD-10-CM

## 2024-01-27 NOTE — Progress Notes (Signed)
 Location:  [facility] Room Number: N043-A Place of Service:  SNF (31) Provider:  Effie Wahlert X, NP  Patient Care Team: Marley Pakula X, NP as PCP - General (Internal Medicine) Charmayne Molly, MD as Consulting Physician (Ophthalmology)  Extended Emergency Contact Information Primary Emergency Contact: [name],[name]  [place] Home Phone: [phone] Mobile Phone: [phone] Relation: Spouse Secondary Emergency Contact: [name],[name] Mobile Phone: [phone] Relation: Son Preferred language: English Interpreter needed? No  Code Status:  DNR Goals of care: Advanced Directive information    01/27/2024   10:54 AM  Advanced Directives  Does Patient Have a Medical Advance Directive? Yes  Type of Advance Directive Out of facility DNR (pink MOST or yellow form)  Does patient want to make changes to medical advance directive? No - Patient declined     Chief Complaint  Patient presents with  . Medical Management of Chronic Issues    Routine Visit, needs to discuss medicare annual wellness visit, hemoglobin A1C, and tetanus vaccine.    HPI:  Pt is a 83 y.o. male seen today for medical management of chronic diseases.     Hospitalized 09/13/2023 to 09/17/2023 for status post fall, he was a found to have hypotensive, losartan which was discontinued, amlodipine  was added.  CT head and chest x-ray were negative.              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 CT 09/13/23 no acute intracranial process, s/p R mastoidectomy.              Parkinson's disease, followed by Neurology, taking Sinemet .              Anxiety, better in SNF [facility], TSH 4.11, Vit B 872, Vit D 37 07/27/23              HLD, on ASA,  Atorvastatin , LDL 77 07/27/23             Constipation, every 3-4 days BM, hard, MiraLax  is not adequate, agreed to Senokot S at bedtime.              Hyponatremia, Na 136 01/25/24 on salt tab    Past Medical History:  Diagnosis Date  . Diabetes mellitus without complication (HCC)   . High cholesterol   . Hypertension   . Parkinson disease [facility])    Past Surgical History:  Procedure Laterality Date  . CATARACT EXTRACTION    . MASTOIDECTOMY    . VASECTOMY      No Known Allergies  Outpatient Encounter Medications as of 01/27/2024  Medication Sig  . amLODipine  (NORVASC ) 10 MG tablet Take 10 mg by mouth daily.  . aspirin  EC 81 MG tablet Take 81 mg by mouth daily.  . atorvastatin  (LIPITOR) 20 MG tablet Take 20 mg by mouth daily.  . carbidopa -levodopa  (SINEMET  IR) 25-100 MG tablet Take 1-2 tablets by mouth See admin instructions. Take 1 tablet by mouth in the morning & at bedtime and 2 tablets at noontime  . carbidopa -levodopa  (SINEMET  IR) 25-100  MG tablet Take 2 tablets by mouth daily.  . Multiple Vitamins-Minerals (CENTRUM MINIS MEN 50+ PO) Take 1 tablet by mouth daily after supper.  . polyethylene glycol (MIRALAX  / GLYCOLAX ) 17 g packet Take 17 g by mouth daily as needed for mild constipation.  . sennosides-docusate sodium (SENOKOT-S) 8.6-50 MG tablet Take 1 tablet by mouth daily. Give 1 tablet by mouth at bedtime related to CONSTIPATION, UNSPECIFIED  . sodium chloride  1 g tablet Take 1 g by mouth 2 (two) times daily.  . [DISCONTINUED] amLODipine  (NORVASC ) 5 MG tablet Take 2 tablets (10 mg total) by mouth daily.   No facility-administered encounter medications on file as of 01/27/2024.    Review of Systems  Constitutional:  Negative for appetite change and fatigue.  HENT:  Negative for congestion and trouble swallowing.   Eyes:  Negative for visual disturbance.  Respiratory:  Negative for cough and shortness of breath.   Cardiovascular:  Negative for leg  swelling.  Gastrointestinal:  Negative for abdominal pain and constipation.  Genitourinary:  Negative for frequency.  Musculoskeletal:  Positive for arthralgias and gait problem.  Neurological:  Negative for weakness and light-headedness.  Psychiatric/Behavioral:  Negative for behavioral problems and sleep disturbance. The patient is not nervous/anxious.     Immunization History  Administered Date(s) Administered  . INFLUENZA, HIGH DOSE SEASONAL PF 11/16/2014, 11/27/2015, 12/16/2017, 12/05/2021, 12/08/2022, 12/21/2023  . Moderna Covid-19 Fall Seasonal Vaccine 46yrs & older 12/08/2022  . Moderna Covid-19 Vaccine Bivalent Booster 27yrs & up 12/05/2021  . PFIZER Comirnaty(Gray Top)Covid-19 Tri-Sucrose Vaccine 12/29/2019, 08/16/2020  . PFIZER(Purple Top)SARS-COV-2 Vaccination 04/17/2019, 05/08/2019  . Pneumococcal Conjugate-13 11/13/2013  . Pneumococcal Polysaccharide-23 07/07/2004, 07/11/2009  . RSV,unspecified 04/09/2023  . Td (Adult),5 Lf Tetanus Toxid, Preservative Free 08/07/1997  . Tdap 11/26/2010  . Unspecified SARS-COV-2 Vaccination 01/10/2024  . Zoster Recombinant(Shingrix) 12/04/2016, 05/13/2017  . Zoster, Live 08/05/2011   Pertinent  Health Maintenance Due  Topic Date Due  . HEMOGLOBIN A1C  01/27/2024  . OPHTHALMOLOGY EXAM  07/25/2024  . FOOT EXAM  08/11/2024  . Influenza Vaccine  Completed      07/18/2021   10:57 AM 07/15/2023    2:57 PM  Fall Risk  Falls in the past year?  0  Was there an injury with Fall?  0  Fall Risk Category Calculator  0  (RETIRED) Patient Fall Risk Level Moderate fall risk    Patient at Risk for Falls Due to  No Fall Risks  Fall risk Follow up  Falls evaluation completed     Data saved with a previous flowsheet row definition   Functional Status Survey:    Vitals:   01/27/24 1034  BP: 128/66  Pulse: 66  Resp: 18  Temp: (!) 97.3 F (36.3 C)  SpO2: 97%  Weight: 158 lb 9.6 oz (71.9 kg)  Height: 5' 8 (1.727 m)   Body mass index is  24.12 kg/m. Physical Exam Constitutional:      Appearance: Normal appearance.  Cardiovascular:     Rate and Rhythm: Normal rate and regular rhythm.     Heart sounds: Murmur heard.  Pulmonary:     Effort: Pulmonary effort is normal.     Breath sounds: No rales.  Abdominal:     General: Bowel sounds are normal.     Palpations: Abdomen is soft.     Tenderness: There is no abdominal tenderness.  Musculoskeletal:     Right lower leg: No edema.     Left lower leg: No edema.  Skin:  General: Skin is warm and dry.  Neurological:     Mental Status: He is alert. Mental status is at baseline.     Motor: No weakness.  Psychiatric:        Mood and Affect: Mood normal.        Behavior: Behavior normal.        Thought Content: Thought content normal.        Judgment: Judgment normal.     Labs reviewed: Recent Labs    09/15/23 0550 09/16/23 0525 09/17/23 0533 10/21/23 0000 01/20/24 0740 01/25/24 0730  NA 128* 128* 130* 133* 135* 136*  K 4.3 3.6 3.6 3.5 3.7 3.9  CL 98 99 96* 101 100 102  CO2 22 22 23  25* 25* 27*  GLUCOSE 112* 110* 105*  --   --   --   BUN 12 10 13 11 10 12   CREATININE 0.86 0.77 0.88 0.7 0.8 0.8  CALCIUM  8.3* 8.4* 8.5* 9.1 9.4 9.0  MG 1.8 1.7 1.8  --   --   --   PHOS 2.9  --   --   --   --   --    Recent Labs    07/27/23 0809 08/30/23 1025 09/13/23 1538 09/14/23 0547  AST 22 26  --  19  ALT 36 11  --  7  ALKPHOS  --  64  --  68  BILITOT 1.2 1.4*  --  1.6*  PROT 6.9 6.6  --  5.7*  ALBUMIN  --  3.6 2.6* 3.2*   Recent Labs    07/27/23 0809 08/30/23 1025 09/13/23 1538 09/15/23 0550 09/16/23 0525 09/17/23 0533  WBC 9.8 9.6   < > 11.1* 11.4* 9.7  NEUTROABS 7,056 7.9*  --   --   --   --   HGB 16.3 15.5   < > 13.9 13.6 13.9  HCT 47.0 46.0   < > 39.9 38.6* 38.9*  MCV 95.3 97.3   < > 94.8 94.1 93.5  PLT 119* 179   < > 186 177 184   < > = values in this interval not displayed.   Lab Results  Component Value Date   TSH 2.059 09/14/2023   Lab  Results  Component Value Date   HGBA1C 6.7 (H) 07/27/2023   Lab Results  Component Value Date   CHOL 153 07/27/2023   HDL 58 07/27/2023   LDLCALC 77 07/27/2023   TRIG 98 07/27/2023   CHOLHDL 2.6 07/27/2023    Significant Diagnostic Results in last 30 days:  No results found.  Assessment/Plan HTN (hypertension) Blood pressure is controlled, on Amlodipine , off  Valsartan, Bun/creat 12/0.8 01/25/24  Orthostatic hypotension Change position slowly.   History of syncope  Hx of Syncope, evaluated by Cardiology, unremarkable workups. CT 09/13/23 no acute intracranial process, s/p R mastoidectomy.   Parkinson's disease (HCC)  followed by Neurology, taking Sinemet .   Anxiety Improved since SNF [facility] for care  Slow transit constipation No change every 3-4 days BM, hard, MiraLax  is not adequate, agreed to Senokot S at bedtime.   Hyponatremia Na 136 01/25/24 on salt tab     Family/ staff Communication: plan of care reviewed with the patient and charge nurse.   Labs/tests ordered:  none

## 2024-01-28 NOTE — Assessment & Plan Note (Signed)
 Blood pressure is controlled, on Amlodipine , off  Valsartan, Bun/creat 12/0.8 01/25/24

## 2024-01-28 NOTE — Assessment & Plan Note (Signed)
 followed by Neurology, taking Sinemet.

## 2024-01-28 NOTE — Assessment & Plan Note (Signed)
 Na 136 01/25/24 on salt tab

## 2024-01-28 NOTE — Assessment & Plan Note (Signed)
 Hx of Syncope, evaluated by Cardiology, unremarkable workups. CT 09/13/23 no acute intracranial process, s/p R mastoidectomy.

## 2024-01-28 NOTE — Assessment & Plan Note (Signed)
 No change every 3-4 days BM, hard, MiraLax  is not adequate, agreed to Senokot S at bedtime.

## 2024-01-28 NOTE — Assessment & Plan Note (Signed)
 Change position slowly.

## 2024-01-28 NOTE — Assessment & Plan Note (Signed)
 Improved since SNF [facility] for care

## 2024-02-14 ENCOUNTER — Encounter: Payer: Self-pay | Admitting: Nurse Practitioner

## 2024-03-06 ENCOUNTER — Non-Acute Institutional Stay (SKILLED_NURSING_FACILITY): Payer: Self-pay | Admitting: Nurse Practitioner

## 2024-03-06 ENCOUNTER — Encounter: Payer: Self-pay | Admitting: Nurse Practitioner

## 2024-03-06 DIAGNOSIS — M150 Primary generalized (osteo)arthritis: Secondary | ICD-10-CM

## 2024-03-06 DIAGNOSIS — I10 Essential (primary) hypertension: Secondary | ICD-10-CM | POA: Diagnosis not present

## 2024-03-06 DIAGNOSIS — E871 Hypo-osmolality and hyponatremia: Secondary | ICD-10-CM

## 2024-03-06 DIAGNOSIS — I951 Orthostatic hypotension: Secondary | ICD-10-CM

## 2024-03-06 DIAGNOSIS — K5901 Slow transit constipation: Secondary | ICD-10-CM | POA: Diagnosis not present

## 2024-03-06 DIAGNOSIS — E785 Hyperlipidemia, unspecified: Secondary | ICD-10-CM | POA: Diagnosis not present

## 2024-03-06 DIAGNOSIS — E1165 Type 2 diabetes mellitus with hyperglycemia: Secondary | ICD-10-CM | POA: Diagnosis not present

## 2024-03-06 DIAGNOSIS — G20A1 Parkinson's disease without dyskinesia, without mention of fluctuations: Secondary | ICD-10-CM

## 2024-03-06 DIAGNOSIS — M159 Polyosteoarthritis, unspecified: Secondary | ICD-10-CM | POA: Insufficient documentation

## 2024-03-06 NOTE — Assessment & Plan Note (Signed)
 Managed with changing positions slowly

## 2024-03-06 NOTE — Assessment & Plan Note (Signed)
 on ASA,  Atorvastatin , LDL 77 07/27/23

## 2024-03-06 NOTE — Progress Notes (Signed)
 " Location:   [facility] Room Number: 9 Place of Service:  SNF (31) Provider: Larwance Kendle Erker NP  Keidrick Murty X, NP  Patient Care Team: Xavian Hardcastle X, NP as PCP - General (Internal Medicine) Charmayne Molly, MD as Consulting Physician (Ophthalmology)  Extended Emergency Contact Information Primary Emergency Contact: [name],[name]  [place] Home Phone: [phone] Mobile Phone: [phone] Relation: Spouse Secondary Emergency Contact: [name],[name] Mobile Phone: [phone] Relation: Son Preferred language: English Interpreter needed? No  Code Status:  DNR Goals of care: Advanced Directive information    01/27/2024   10:54 AM  Advanced Directives  Does Patient Have a Medical Advance Directive? Yes  Type of Advance Directive Out of facility DNR (pink MOST or yellow form)  Does patient want to make changes to medical advance directive? No - Patient declined     Chief Complaint  Patient presents with   Medical Management of Chronic Issues    HPI:  Pt is a 83 y.o. male seen today for medical management of chronic diseases.    OA/stiffness overall, complaining of left lower back/hip/femur pain with movement, able to ambulate with walker.  Near fall in the bathroom a few days ago resulted in the right wrist skin tear.   Hospitalized 09/13/2023 to 09/17/2023 for status post fall, he was a found to have hypotensive, losartan which was discontinued, amlodipine  was added.  CT head and chest x-ray were negative.              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 CT 09/13/23 no acute intracranial process, s/p R mastoidectomy.              Parkinson's disease, followed by  Neurology, taking Sinemet .              Anxiety, better in SNF [facility], TSH 4.11, Vit B 872, Vit D 37 07/27/23             HLD, on ASA,  Atorvastatin , LDL 77 07/27/23             Constipation, every 3-4 days BM, hard, MiraLax  is not adequate, agreed to Senokot S at bedtime.              Hyponatremia, Na 136 01/25/24 on salt tab   Past Medical History:  Diagnosis Date   Diabetes mellitus without complication (HCC)    High cholesterol    Hypertension    Parkinson disease (HCC)    Past Surgical History:  Procedure Laterality Date   CATARACT EXTRACTION     MASTOIDECTOMY     VASECTOMY      Allergies[1]  Allergies as of 03/06/2024   No Known Allergies      Medication List        Accurate as of March 06, 2024  2:09 PM. If you have any questions, ask your nurse or doctor.          carbidopa -levodopa  25-100 MG tablet Commonly known as: SINEMET  IR Take 1-2 tablets by mouth See  admin instructions. Take 1 tablet by mouth in the morning & at bedtime and 2 tablets at noontime The timing of this medication is very important.   carbidopa -levodopa  25-100 MG tablet Commonly known as: SINEMET  IR Take 2 tablets by mouth daily. The timing of this medication is very important.   acetaminophen  325 MG tablet Commonly known as: TYLENOL  Take 650 mg by mouth 3 (three) times daily.   amLODipine  10 MG tablet Commonly known as: NORVASC  Take 10 mg by mouth daily.   aspirin  EC 81 MG tablet Take 81 mg by mouth daily.   atorvastatin  20 MG tablet Commonly known as: LIPITOR Take 20 mg by mouth daily.   CENTRUM MINIS MEN 50+ PO Take 1 tablet by mouth daily after supper.   methocarbamol 500 MG tablet Commonly known as: ROBAXIN Take 500 mg by mouth 3 (three) times daily as needed for muscle spasms.   polyethylene glycol 17 g packet Commonly known as: MIRALAX  / GLYCOLAX  Take 17 g by mouth daily as needed for mild constipation.   sennosides-docusate sodium 8.6-50 MG tablet Commonly  known as: SENOKOT-S Take 1 tablet by mouth daily. Give 1 tablet by mouth at bedtime related to CONSTIPATION, UNSPECIFIED   sodium chloride  1 g tablet Take 1 g by mouth 2 (two) times daily.        Review of Systems  Constitutional:  Negative for appetite change and fatigue.  HENT:  Negative for congestion and trouble swallowing.   Eyes:  Negative for visual disturbance.  Respiratory:  Negative for cough and shortness of breath.   Cardiovascular:  Negative for leg swelling.  Gastrointestinal:  Negative for abdominal pain and constipation.  Genitourinary:  Negative for frequency.  Musculoskeletal:  Positive for arthralgias, back pain and gait problem.       Left lower back, hip, femur pain with movement.   Skin:  Positive for wound.  Neurological:  Negative for weakness and light-headedness.  Psychiatric/Behavioral:  Negative for behavioral problems and sleep disturbance. The patient is not nervous/anxious.     Immunization History  Administered Date(s) Administered   INFLUENZA, HIGH DOSE SEASONAL PF 11/16/2014, 11/27/2015, 12/16/2017, 12/05/2021, 12/08/2022, 12/21/2023   Moderna Covid-19 Fall Seasonal Vaccine 13yrs & older 12/08/2022   Moderna Covid-19 Vaccine Bivalent Booster 44yrs & up 12/05/2021   PFIZER Comirnaty(Gray Top)Covid-19 Tri-Sucrose Vaccine 12/29/2019, 08/16/2020   PFIZER(Purple Top)SARS-COV-2 Vaccination 04/17/2019, 05/08/2019   Pneumococcal Conjugate-13 11/13/2013   Pneumococcal Polysaccharide-23 07/07/2004, 07/11/2009   RSV,unspecified 04/09/2023   Td (Adult),5 Lf Tetanus Toxid, Preservative Free 08/07/1997   Tdap 11/26/2010   Unspecified SARS-COV-2 Vaccination 01/10/2024   Zoster Recombinant(Shingrix) 12/04/2016, 05/13/2017   Zoster, Live 08/05/2011   Pertinent  Health Maintenance Due  Topic Date Due   HEMOGLOBIN A1C  01/27/2024   OPHTHALMOLOGY EXAM  07/25/2024   FOOT EXAM  08/11/2024   Influenza Vaccine  Completed      07/18/2021   10:57 AM 07/15/2023     2:57 PM  Fall Risk  Falls in the past year?  0  Was there an injury with Fall?  0   Fall Risk Category Calculator  0  (RETIRED) Patient Fall Risk Level Moderate fall risk    Patient at Risk for Falls Due to  No Fall Risks  Fall risk Follow up  Falls evaluation completed     Data saved with a previous flowsheet row definition   Functional Status Survey:    Vitals:   03/06/24 1309  BP: (!) 159/85  Pulse: 83  Resp:  18  Temp: (!) 97 F (36.1 C)  SpO2: 97%  Weight: 156 lb 11.2 oz (71.1 kg)   Body mass index is 23.83 kg/m. Physical Exam Constitutional:      Appearance: Normal appearance.  Cardiovascular:     Rate and Rhythm: Normal rate and regular rhythm.     Heart sounds: Murmur heard.  Pulmonary:     Effort: Pulmonary effort is normal.     Breath sounds: No rales.  Abdominal:     General: Bowel sounds are normal.     Palpations: Abdomen is soft.     Tenderness: There is no abdominal tenderness.  Musculoskeletal:        General: Tenderness present.     Right lower leg: No edema.     Left lower leg: No edema.     Comments: Left SIJ, hip/femur pain with straight leg raise and movement, able to ambulate with walker, no deformity, no spinal spinous processes tenderness palpated.   Skin:    General: Skin is warm and dry.     Comments: Right wrist skin tear: No active bleeding or signs symptoms of infection  Neurological:     Mental Status: He is alert. Mental status is at baseline.     Motor: No weakness.  Psychiatric:        Mood and Affect: Mood normal.        Behavior: Behavior normal.        Thought Content: Thought content normal.        Judgment: Judgment normal.     Labs reviewed: Recent Labs    09/15/23 0550 09/16/23 0525 09/17/23 0533 10/21/23 0000 01/20/24 0740 01/25/24 0730  NA 128* 128* 130* 133* 135* 136*  K 4.3 3.6 3.6 3.5 3.7 3.9  CL 98 99 96* 101 100 102  CO2 22 22 23  25* 25* 27*  GLUCOSE 112* 110* 105*  --   --   --   BUN 12 10 13 11  10 12   CREATININE 0.86 0.77 0.88 0.7 0.8 0.8  CALCIUM  8.3* 8.4* 8.5* 9.1 9.4 9.0  MG 1.8 1.7 1.8  --   --   --   PHOS 2.9  --   --   --   --   --    Recent Labs    07/27/23 0809 08/30/23 1025 09/13/23 1538 09/14/23 0547  AST 22 26  --  19  ALT 36 11  --  7  ALKPHOS  --  64  --  68  BILITOT 1.2 1.4*  --  1.6*  PROT 6.9 6.6  --  5.7*  ALBUMIN  --  3.6 2.6* 3.2*   Recent Labs    07/27/23 0809 08/30/23 1025 09/13/23 1538 09/15/23 0550 09/16/23 0525 09/17/23 0533  WBC 9.8 9.6   < > 11.1* 11.4* 9.7  NEUTROABS 7,056 7.9*  --   --   --   --   HGB 16.3 15.5   < > 13.9 13.6 13.9  HCT 47.0 46.0   < > 39.9 38.6* 38.9*  MCV 95.3 97.3   < > 94.8 94.1 93.5  PLT 119* 179   < > 186 177 184   < > = values in this interval not displayed.   Lab Results  Component Value Date   TSH 2.059 09/14/2023   Lab Results  Component Value Date   HGBA1C 6.7 (H) 07/27/2023   Lab Results  Component Value Date   CHOL 153 07/27/2023   HDL 58  07/27/2023   LDLCALC 77 07/27/2023   TRIG 98 07/27/2023   CHOLHDL 2.6 07/27/2023    Significant Diagnostic Results in last 30 days:  No results found.  Assessment/Plan  Osteoarthritis, multiple sites OA/stiffness overall, complaining of left lower back/hip/femur pain with movement, able to ambulate with walker.  Near fall in the bathroom a few days ago resulted  in the right wrist skin tear. X-ray lumbar spine, pelvis, left hip/femur Tylenol  650 mg 3 times daily with meals for 10 days then as needed, Robaxin 500 mg 3 times daily as needed  Type 2 diabetes mellitus (HCC) diet controlled, Hgb A1c 6.7 07/27/23  HTN (hypertension) Blood pressure is controlled on Amlodipine , off  Valsartan, Bun/creat 12/0.8 01/25/24  Orthostatic hypotension Managed with changing positions slowly  Parkinson's disease (HCC) Not disabling, able to feed himself, more bed rest and generalized stiffness noted  followed by Neurology, taking Sinemet .   HLD  (hyperlipidemia) on ASA,  Atorvastatin , LDL 77 07/27/23  Hyponatremia Stable Continue salt tablets NA 136 01/25/2024  Slow transit constipation Stable every 3-4 days BM, hard, MiraLax  is not adequate, agreed to Senokot S at bedtime.    Family/ staff Communication: Plan of care reviewed with the patient and charge nurse  Labs/tests ordered: X-ray lumbar spine, pelvis, left hip/femur      [1] No Known Allergies  "

## 2024-03-06 NOTE — Assessment & Plan Note (Signed)
 Not disabling, able to feed himself, more bed rest and generalized stiffness noted  followed by Neurology, taking Sinemet .

## 2024-03-06 NOTE — Assessment & Plan Note (Signed)
 Blood pressure is controlled on Amlodipine , off  Valsartan, Bun/creat 12/0.8 01/25/24

## 2024-03-06 NOTE — Assessment & Plan Note (Signed)
 OA/stiffness overall, complaining of left lower back/hip/femur pain with movement, able to ambulate with walker.  Near fall in the bathroom a few days ago resulted  in the right wrist skin tear. X-ray lumbar spine, pelvis, left hip/femur Tylenol  650 mg 3 times daily with meals for 10 days then as needed, Robaxin 500 mg 3 times daily as needed

## 2024-03-06 NOTE — Assessment & Plan Note (Signed)
 diet controlled, Hgb A1c 6.7 07/27/23

## 2024-03-06 NOTE — Assessment & Plan Note (Signed)
 Stable Continue salt tablets NA 136 01/25/2024

## 2024-03-06 NOTE — Assessment & Plan Note (Signed)
 Stable every 3-4 days BM, hard, MiraLax  is not adequate, agreed to Senokot S at bedtime.

## 2024-04-07 ENCOUNTER — Non-Acute Institutional Stay (SKILLED_NURSING_FACILITY): Payer: Self-pay | Admitting: Family Medicine

## 2024-04-07 DIAGNOSIS — G20A1 Parkinson's disease without dyskinesia, without mention of fluctuations: Secondary | ICD-10-CM | POA: Diagnosis not present

## 2024-04-07 DIAGNOSIS — I951 Orthostatic hypotension: Secondary | ICD-10-CM | POA: Diagnosis not present

## 2024-04-07 DIAGNOSIS — E1165 Type 2 diabetes mellitus with hyperglycemia: Secondary | ICD-10-CM | POA: Diagnosis not present

## 2024-04-07 DIAGNOSIS — E785 Hyperlipidemia, unspecified: Secondary | ICD-10-CM | POA: Diagnosis not present

## 2024-04-07 DIAGNOSIS — I10 Essential (primary) hypertension: Secondary | ICD-10-CM

## 2024-04-07 NOTE — Assessment & Plan Note (Addendum)
 Sx present x 4-5 years; no tremor noted but stiff

## 2024-04-07 NOTE — Assessment & Plan Note (Addendum)
 Controlled with diet, last A1C 6.7

## 2024-04-07 NOTE — Assessment & Plan Note (Addendum)
 On no meds ; likely related to parkinson

## 2024-04-07 NOTE — Assessment & Plan Note (Addendum)
 LDL at goal on atorvastatin.

## 2024-04-07 NOTE — Assessment & Plan Note (Addendum)
 BP controlled on almodipine

## 2024-04-07 NOTE — Progress Notes (Signed)
 " Provider:  Garnette Pinal, MD Location:      Place of Service:     PCP: Mast, Man X, NP Patient Care Team: Mast, Man X, NP as PCP - General (Internal Medicine) Charmayne Molly, MD as Consulting Physician (Ophthalmology)  Extended Emergency Contact Information Primary Emergency Contact: [name],[name]  [place] Home Phone: [phone] Mobile Phone: [phone] Relation: Spouse Secondary Emergency Contact: [name],[name] Mobile Phone: [phone] Relation: Son Preferred language: English Interpreter needed? No  Code Status:  Goals of Care: Advanced Directive information    01/27/2024   10:54 AM  Advanced Directives  Does Patient Have a Medical Advance Directive? Yes  Type of Advance Directive Out of facility DNR (pink MOST or yellow form)  Does patient want to make changes to medical advance directive? No - Patient declined     HPI: Patient is a 84 y.o. male seen today for medical management of chronic problems including: Parkinson's disease,diabetes, hypertension, and hyperlipidemia. Getting out of bed more walking with walker and aid of wife. Denies issues with swallowing, memory, tremor. Does have constipation.  Followed by neurology; on Sinemet . Denies complaints today. Past Medical History:  Diagnosis Date   Diabetes mellitus without complication (HCC)    High cholesterol    Hypertension    Parkinson disease (HCC)    Past Surgical History:  Procedure Laterality Date   CATARACT EXTRACTION     MASTOIDECTOMY     VASECTOMY      reports that he has quit smoking. His smoking use included cigarettes. He has never used smokeless tobacco. He reports current alcohol use of about 7.0 standard drinks of alcohol per week. He reports that he does not use drugs. Social History   Socioeconomic History   Marital status: Married    Spouse name: Not on file   Number of children: 3   Years of education: Not on file   Highest education level: Bachelor's  degree (e.g., BA, AB, BS)  Occupational History   Not on file  Tobacco Use   Smoking status: Former    Types: Cigarettes   Smokeless tobacco: Never   Tobacco comments:    Quit in 2005  Vaping Use   Vaping status: Never Used  Substance and Sexual Activity   Alcohol use: Yes    Alcohol/week: 7.0 standard drinks of alcohol    Types: 7 Glasses of wine per week    Comment: white wine ever evening   Drug use: Never   Sexual activity: Not on file  Other Topics Concern   Not on file  Social History Narrative   Not on file   Social Drivers of Health   Tobacco Use: Medium Risk (03/06/2024)   Patient History    Smoking Tobacco Use: Former    Smokeless Tobacco Use: Never    Passive Exposure: Not on file  Financial Resource Strain: Low Risk (07/11/2021)   Received from [facility]   Overall Financial Resource Strain (CARDIA)    Difficulty of Paying Living Expenses: Not hard at all  Food Insecurity: No Food Insecurity (09/13/2023)   Epic    Worried About Radiation Protection Practitioner of Food in the Last Year: Never true    Ran Out of Food in the Last Year: Never true  Transportation Needs: No Transportation Needs (09/13/2023)   Epic    Lack of Transportation (Medical): No    Lack of Transportation (Non-Medical): No  Physical Activity: Unknown (07/11/2021)   Received from [facility], [facility] visits  prior to 05/16/2022.   Exercise Vital Sign    On average, how many days per week do you engage in moderate to strenuous exercise (like a brisk walk)?: 0 days    Minutes of Exercise per Session: Not on file  Stress: No Stress Concern Present (07/11/2021)   Received from [facility], [facility] visits prior to 05/16/2022.   [employer] of Occupational Health - Occupational Stress Questionnaire    Feeling of Stress : Only a little  Social Connections: Socially Isolated (09/13/2023)   Social Connection and Isolation Panel    Frequency of Communication  with Friends and Family: Never    Frequency of Social Gatherings with Friends and Family: Never    Attends Religious Services: Never    Database Administrator or Organizations: No    Attends Banker Meetings: Never    Marital Status: Married  Catering Manager Violence: Not At Risk (09/13/2023)   Epic    Fear of Current or Ex-Partner: No    Emotionally Abused: No    Physically Abused: No    Sexually Abused: No  Depression (PHQ2-9): Low Risk (07/15/2023)   Depression (PHQ2-9)    PHQ-2 Score: 0  Alcohol Screen: Not on file  Housing: Low Risk (09/13/2023)   Epic    Unable to Pay for Housing in the Last Year: No    Number of Times Moved in the Last Year: 0    Homeless in the Last Year: No  Utilities: Not At Risk (09/13/2023)   Epic    Threatened with loss of utilities: No  Health Literacy: Not on file    Functional Status Survey:    No family history on file.  Health Maintenance  Topic Date Due   DTaP/Tdap/Td (2 - Td or Tdap) 11/25/2020   Medicare Annual Wellness (AWV)  07/19/2022   HEMOGLOBIN A1C  01/27/2024   COVID-19 Vaccine (8 - 2025-26 season) 07/10/2024   OPHTHALMOLOGY EXAM  07/25/2024   FOOT EXAM  08/11/2024   Diabetic kidney evaluation - Urine ACR  09/01/2024   Diabetic kidney evaluation - eGFR measurement  01/24/2025   Pneumococcal Vaccine: 50+ Years  Completed   Influenza Vaccine  Completed   Zoster Vaccines- Shingrix  Completed   Meningococcal B Vaccine  Aged Out    Allergies[1]  Outpatient Encounter Medications as of 04/07/2024  Medication Sig   acetaminophen  (TYLENOL ) 325 MG tablet Take 650 mg by mouth 3 (three) times daily.   amLODipine  (NORVASC ) 10 MG tablet Take 10 mg by mouth daily.   aspirin  EC 81 MG tablet Take 81 mg by mouth daily.   atorvastatin  (LIPITOR) 20 MG tablet Take 20 mg by mouth daily.   carbidopa -levodopa  (SINEMET  IR) 25-100 MG tablet Take 1-2 tablets by mouth See admin instructions. Take 1 tablet by mouth in the morning & at  bedtime and 2 tablets at noontime   carbidopa -levodopa  (SINEMET  IR) 25-100 MG tablet Take 2 tablets by mouth daily.   methocarbamol (ROBAXIN) 500 MG tablet Take 500 mg by mouth 3 (three) times daily as needed for muscle spasms.   Multiple Vitamins-Minerals (CENTRUM MINIS MEN 50+ PO) Take 1 tablet by mouth daily after supper.   polyethylene glycol (MIRALAX  / GLYCOLAX ) 17 g packet Take 17 g by mouth daily as needed for mild constipation.   sennosides-docusate sodium (SENOKOT-S) 8.6-50 MG tablet Take 1 tablet by mouth daily. Give 1 tablet by mouth at bedtime related to CONSTIPATION, UNSPECIFIED   sodium chloride  1  g tablet Take 1 g by mouth 2 (two) times daily.   No facility-administered encounter medications on file as of 04/07/2024.    Review of Systems  Constitutional: Negative.   HENT: Negative.    Respiratory: Negative.    Cardiovascular: Negative.   Gastrointestinal: Negative.   Genitourinary: Negative.   Musculoskeletal:  Positive for gait problem.  Neurological:  Positive for dizziness and tremors.  Psychiatric/Behavioral: Negative.      There were no vitals filed for this visit. There is no height or weight on file to calculate BMI. Physical Exam Constitutional:      Appearance: Normal appearance.  HENT:     Head: Normocephalic.  Cardiovascular:     Rate and Rhythm: Normal rate and regular rhythm.     Heart sounds: Murmur heard.  Pulmonary:     Effort: Pulmonary effort is normal.     Breath sounds: Normal breath sounds.  Musculoskeletal:     Comments: Uses walker for ambulation  Skin:    General: Skin is warm and dry.  Neurological:     Mental Status: He is alert.     Labs reviewed: Basic Metabolic Panel: Recent Labs    09/15/23 0550 09/16/23 0525 09/17/23 0533 10/21/23 0000 01/20/24 0740 01/25/24 0730  NA 128* 128* 130* 133* 135* 136*  K 4.3 3.6 3.6 3.5 3.7 3.9  CL 98 99 96* 101 100 102  CO2 22 22 23  25* 25* 27*  GLUCOSE 112* 110* 105*  --   --   --    BUN 12 10 13 11 10 12   CREATININE 0.86 0.77 0.88 0.7 0.8 0.8  CALCIUM  8.3* 8.4* 8.5* 9.1 9.4 9.0  MG 1.8 1.7 1.8  --   --   --   PHOS 2.9  --   --   --   --   --    Liver Function Tests: Recent Labs    07/27/23 0809 08/30/23 1025 09/13/23 1538 09/14/23 0547  AST 22 26  --  19  ALT 36 11  --  7  ALKPHOS  --  64  --  68  BILITOT 1.2 1.4*  --  1.6*  PROT 6.9 6.6  --  5.7*  ALBUMIN  --  3.6 2.6* 3.2*   No results for input(s): LIPASE, AMYLASE in the last 8760 hours. No results for input(s): AMMONIA in the last 8760 hours. CBC: Recent Labs    07/27/23 0809 08/30/23 1025 09/13/23 1538 09/15/23 0550 09/16/23 0525 09/17/23 0533  WBC 9.8 9.6   < > 11.1* 11.4* 9.7  NEUTROABS 7,056 7.9*  --   --   --   --   HGB 16.3 15.5   < > 13.9 13.6 13.9  HCT 47.0 46.0   < > 39.9 38.6* 38.9*  MCV 95.3 97.3   < > 94.8 94.1 93.5  PLT 119* 179   < > 186 177 184   < > = values in this interval not displayed.   Cardiac Enzymes: No results for input(s): CKTOTAL, CKMB, CKMBINDEX, TROPONINI in the last 8760 hours. BNP: Invalid input(s): POCBNP Lab Results  Component Value Date   HGBA1C 6.7 (H) 07/27/2023   Lab Results  Component Value Date   TSH 2.059 09/14/2023   Lab Results  Component Value Date   VITAMINB12 872 07/27/2023   No results found for: FOLATE No results found for: IRON, TIBC, FERRITIN  Imaging and Procedures obtained prior to SNF admission: CT Head Wo Contrast Result Date: 09/13/2023 CLINICAL  DATA:  Lightheaded with frequent falls. EXAM: CT HEAD WITHOUT CONTRAST TECHNIQUE: Contiguous axial images were obtained from the base of the skull through the vertex without intravenous contrast. RADIATION DOSE REDUCTION: This exam was performed according to the departmental dose-optimization program which includes automated exposure control, adjustment of the mA and/or kV according to patient size and/or use of iterative reconstruction technique. COMPARISON:   Jul 18, 2021 FINDINGS: Brain: There is generalized cerebral atrophy with widening of the extra-axial spaces and ventricular dilatation. There are areas of decreased attenuation within the white matter tracts of the supratentorial brain, consistent with microvascular disease changes. Vascular: No hyperdense vessel or unexpected calcification. Skull: Normal. Negative for fracture or focal lesion. Sinuses/Orbits: There is mild anterior left ethmoid sinus mucosal thickening. Postoperative changes consistent with prior right mastoidectomy are noted. Other: None. IMPRESSION: 1. 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 2. No acute intracranial abnormality. 3. Evidence of prior right mastoidectomy. Electronically Signed   By: Suzen Dials M.D.   On: 09/13/2023 18:35   DG Chest Port 1 View Result Date: 09/13/2023 CLINICAL DATA:  Shortness of breath EXAM: PORTABLE CHEST 1 VIEW COMPARISON:  08/30/2023 FINDINGS: No acute airspace disease or effusion. Stable cardiomediastinal silhouette with aortic atherosclerosis. No pneumothorax. Probable calcified granuloma in the left lower lung. IMPRESSION: No active disease. Electronically Signed   By: Luke Bun M.D.   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

## 2024-04-14 ENCOUNTER — Other Ambulatory Visit: Payer: Self-pay | Admitting: Nurse Practitioner

## 2024-04-14 MED ORDER — METHOCARBAMOL 500 MG PO TABS: 500.0000 mg | ORAL_TABLET | Freq: Every day | ORAL | 1 refills | Status: AC
